# Patient Record
Sex: Male | Born: 1935 | Race: Black or African American | Hispanic: No | Marital: Married
Health system: Southern US, Community
[De-identification: ages and names within clinical notes are randomized; demographics above are authoritative.]

---

## 2009-01-08 ENCOUNTER — Encounter: Payer: Self-pay | Admitting: Internal Medicine

## 2009-02-01 ENCOUNTER — Encounter: Payer: Self-pay | Admitting: Internal Medicine

## 2009-12-07 ENCOUNTER — Emergency Department: Payer: Self-pay | Admitting: Emergency Medicine

## 2010-07-10 ENCOUNTER — Ambulatory Visit: Payer: Self-pay | Admitting: Ophthalmology

## 2010-07-22 ENCOUNTER — Ambulatory Visit: Payer: Self-pay | Admitting: Ophthalmology

## 2010-08-20 ENCOUNTER — Ambulatory Visit: Payer: Self-pay | Admitting: Ophthalmology

## 2010-08-26 ENCOUNTER — Ambulatory Visit: Payer: Self-pay | Admitting: Ophthalmology

## 2010-09-10 ENCOUNTER — Ambulatory Visit: Payer: Self-pay | Admitting: Gastroenterology

## 2011-02-05 ENCOUNTER — Ambulatory Visit: Payer: Self-pay | Admitting: Oncology

## 2011-03-05 ENCOUNTER — Ambulatory Visit: Payer: Self-pay | Admitting: Oncology

## 2011-03-25 ENCOUNTER — Ambulatory Visit: Payer: Self-pay | Admitting: Cardiovascular Disease

## 2011-03-30 ENCOUNTER — Ambulatory Visit: Payer: Self-pay | Admitting: Oncology

## 2011-04-04 ENCOUNTER — Ambulatory Visit: Payer: Self-pay | Admitting: Oncology

## 2011-04-09 ENCOUNTER — Inpatient Hospital Stay: Payer: Self-pay | Admitting: Internal Medicine

## 2011-06-29 ENCOUNTER — Ambulatory Visit: Payer: Self-pay | Admitting: Oncology

## 2011-07-03 ENCOUNTER — Ambulatory Visit: Payer: Self-pay | Admitting: Oncology

## 2011-08-21 ENCOUNTER — Ambulatory Visit: Payer: Self-pay | Admitting: Physician Assistant

## 2011-08-31 ENCOUNTER — Ambulatory Visit: Payer: Self-pay | Admitting: Oncology

## 2011-09-02 ENCOUNTER — Ambulatory Visit: Payer: Self-pay | Admitting: Oncology

## 2011-09-02 ENCOUNTER — Ambulatory Visit: Payer: Self-pay | Admitting: Physician Assistant

## 2011-09-11 ENCOUNTER — Ambulatory Visit: Payer: Self-pay | Admitting: Physician Assistant

## 2011-10-03 ENCOUNTER — Ambulatory Visit: Payer: Self-pay | Admitting: Physician Assistant

## 2011-10-26 ENCOUNTER — Ambulatory Visit: Payer: Self-pay | Admitting: Oncology

## 2011-10-26 LAB — CANCER CENTER HEMOGLOBIN: HGB: 9.7 g/dL — ABNORMAL LOW (ref 13.0–18.0)

## 2011-11-02 ENCOUNTER — Ambulatory Visit: Payer: Self-pay | Admitting: Oncology

## 2011-12-24 ENCOUNTER — Ambulatory Visit: Payer: Self-pay | Admitting: Oncology

## 2012-01-03 ENCOUNTER — Ambulatory Visit: Payer: Self-pay | Admitting: Oncology

## 2012-02-15 ENCOUNTER — Ambulatory Visit: Payer: Self-pay | Admitting: Oncology

## 2012-03-04 ENCOUNTER — Ambulatory Visit: Payer: Self-pay | Admitting: Oncology

## 2012-04-11 ENCOUNTER — Ambulatory Visit: Payer: Self-pay | Admitting: Oncology

## 2012-04-11 LAB — IRON AND TIBC
Iron Bind.Cap.(Total): 254 ug/dL (ref 250–450)
Iron Saturation: 31 %

## 2012-04-11 LAB — FERRITIN: Ferritin (ARMC): 115 ng/mL (ref 8–388)

## 2012-04-11 LAB — CANCER CENTER HEMOGLOBIN: HGB: 9.2 g/dL — ABNORMAL LOW (ref 13.0–18.0)

## 2012-04-17 ENCOUNTER — Inpatient Hospital Stay: Payer: Self-pay | Admitting: Family Medicine

## 2012-04-17 LAB — COMPREHENSIVE METABOLIC PANEL
Albumin: 2.7 g/dL — ABNORMAL LOW (ref 3.4–5.0)
Alkaline Phosphatase: 92 U/L (ref 50–136)
Anion Gap: 11 (ref 7–16)
BUN: 49 mg/dL — ABNORMAL HIGH (ref 7–18)
Calcium, Total: 8.5 mg/dL (ref 8.5–10.1)
Co2: 18 mmol/L — ABNORMAL LOW (ref 21–32)
EGFR (African American): 12 — ABNORMAL LOW
Glucose: 145 mg/dL — ABNORMAL HIGH (ref 65–99)
Potassium: 4.7 mmol/L (ref 3.5–5.1)
SGOT(AST): 21 U/L (ref 15–37)
SGPT (ALT): 15 U/L (ref 12–78)
Sodium: 145 mmol/L (ref 136–145)
Total Protein: 6.7 g/dL (ref 6.4–8.2)

## 2012-04-17 LAB — URINALYSIS, COMPLETE
Blood: NEGATIVE
Glucose,UR: 50 mg/dL (ref 0–75)
Ketone: NEGATIVE
Nitrite: NEGATIVE
Ph: 5 (ref 4.5–8.0)
Protein: >=500
RBC,UR: 5 /HPF (ref 0–5)
Specific Gravity: 1.023 (ref 1.003–1.030)
WBC UR: 6 /HPF (ref 0–5)

## 2012-04-17 LAB — CBC
HCT: 28.2 % — ABNORMAL LOW (ref 40.0–52.0)
MCH: 26.3 pg (ref 26.0–34.0)
MCHC: 32.6 g/dL (ref 32.0–36.0)
MCV: 81 fL (ref 80–100)
Platelet: 236 10*3/uL (ref 150–440)
RDW: 15.1 % — ABNORMAL HIGH (ref 11.5–14.5)
WBC: 10.2 10*3/uL (ref 3.8–10.6)

## 2012-04-17 LAB — CK TOTAL AND CKMB (NOT AT ARMC): CK-MB: 2.7 ng/mL (ref 0.5–3.6)

## 2012-04-18 LAB — CBC WITH DIFFERENTIAL/PLATELET
Basophil #: 0 10*3/uL (ref 0.0–0.1)
Eosinophil %: 2.1 %
HCT: 26.8 % — ABNORMAL LOW (ref 40.0–52.0)
MCH: 26.9 pg (ref 26.0–34.0)
MCHC: 33.3 g/dL (ref 32.0–36.0)
MCV: 81 fL (ref 80–100)
Monocyte #: 1.2 x10 3/mm — ABNORMAL HIGH (ref 0.2–1.0)
Neutrophil #: 4.3 10*3/uL (ref 1.4–6.5)
Neutrophil %: 60.4 %
Platelet: 219 10*3/uL (ref 150–440)
RBC: 3.32 10*6/uL — ABNORMAL LOW (ref 4.40–5.90)

## 2012-04-18 LAB — BASIC METABOLIC PANEL
Anion Gap: 7 (ref 7–16)
BUN: 47 mg/dL — ABNORMAL HIGH (ref 7–18)
Creatinine: 4.76 mg/dL — ABNORMAL HIGH (ref 0.60–1.30)
EGFR (African American): 13 — ABNORMAL LOW
Glucose: 74 mg/dL (ref 65–99)
Osmolality: 303 (ref 275–301)

## 2012-04-19 LAB — HEMOGLOBIN: HGB: 9.6 g/dL — ABNORMAL LOW (ref 13.0–18.0)

## 2012-04-19 LAB — IRON AND TIBC
Iron Bind.Cap.(Total): 218 ug/dL — ABNORMAL LOW (ref 250–450)
Iron Saturation: 22 %
Iron: 47 ug/dL — ABNORMAL LOW (ref 65–175)
Unbound Iron-Bind.Cap.: 171 ug/dL

## 2012-04-19 LAB — BASIC METABOLIC PANEL
Anion Gap: 7 (ref 7–16)
Calcium, Total: 8.4 mg/dL — ABNORMAL LOW (ref 8.5–10.1)
Creatinine: 4.54 mg/dL — ABNORMAL HIGH (ref 0.60–1.30)
EGFR (African American): 13 — ABNORMAL LOW
EGFR (Non-African Amer.): 12 — ABNORMAL LOW
Glucose: 88 mg/dL (ref 65–99)
Osmolality: 303 (ref 275–301)
Sodium: 147 mmol/L — ABNORMAL HIGH (ref 136–145)

## 2012-04-19 LAB — FERRITIN: Ferritin (ARMC): 39 ng/mL (ref 8–388)

## 2012-04-19 LAB — URINE CULTURE

## 2012-05-04 ENCOUNTER — Ambulatory Visit: Payer: Self-pay | Admitting: Oncology

## 2012-06-04 ENCOUNTER — Ambulatory Visit: Payer: Self-pay | Admitting: Oncology

## 2012-06-20 LAB — CBC CANCER CENTER
Basophil %: 0.8 %
Eosinophil %: 2.2 %
HGB: 8.4 g/dL — ABNORMAL LOW (ref 13.0–18.0)
Lymphocyte #: 1.4 x10 3/mm (ref 1.0–3.6)
Lymphocyte %: 23.9 %
MCHC: 33.2 g/dL (ref 32.0–36.0)
Monocyte #: 0.6 x10 3/mm (ref 0.2–1.0)
Monocyte %: 10.5 %
Neutrophil %: 62.6 %
RBC: 3.17 10*6/uL — ABNORMAL LOW (ref 4.40–5.90)
RDW: 14.4 % (ref 11.5–14.5)
WBC: 6 x10 3/mm (ref 3.8–10.6)

## 2012-07-02 ENCOUNTER — Ambulatory Visit: Payer: Self-pay | Admitting: Oncology

## 2012-07-06 ENCOUNTER — Ambulatory Visit: Payer: Self-pay | Admitting: Vascular Surgery

## 2012-07-06 LAB — BASIC METABOLIC PANEL
Calcium, Total: 8.1 mg/dL — ABNORMAL LOW (ref 8.5–10.1)
Chloride: 109 mmol/L — ABNORMAL HIGH (ref 98–107)
Co2: 21 mmol/L (ref 21–32)
Creatinine: 7.79 mg/dL — ABNORMAL HIGH (ref 0.60–1.30)
EGFR (African American): 7 — ABNORMAL LOW
Glucose: 282 mg/dL — ABNORMAL HIGH (ref 65–99)
Osmolality: 306 (ref 275–301)
Potassium: 4.6 mmol/L (ref 3.5–5.1)

## 2012-07-06 LAB — CBC
HCT: 27.8 % — ABNORMAL LOW (ref 40.0–52.0)
HGB: 9 g/dL — ABNORMAL LOW (ref 13.0–18.0)
MCH: 26.6 pg (ref 26.0–34.0)
MCHC: 32.4 g/dL (ref 32.0–36.0)
Platelet: 226 10*3/uL (ref 150–440)
RDW: 15.7 % — ABNORMAL HIGH (ref 11.5–14.5)
WBC: 4.6 10*3/uL (ref 3.8–10.6)

## 2012-07-13 ENCOUNTER — Ambulatory Visit: Payer: Self-pay | Admitting: Vascular Surgery

## 2012-08-02 ENCOUNTER — Inpatient Hospital Stay: Payer: Self-pay | Admitting: Internal Medicine

## 2012-08-02 LAB — CBC
HCT: 23.1 % — ABNORMAL LOW (ref 40.0–52.0)
HGB: 7.6 g/dL — ABNORMAL LOW (ref 13.0–18.0)
MCHC: 32.8 g/dL (ref 32.0–36.0)
MCV: 81 fL (ref 80–100)
Platelet: 240 10*3/uL (ref 150–440)

## 2012-08-02 LAB — COMPREHENSIVE METABOLIC PANEL
Alkaline Phosphatase: 70 U/L (ref 50–136)
Anion Gap: 10 (ref 7–16)
BUN: 80 mg/dL — ABNORMAL HIGH (ref 7–18)
Calcium, Total: 8 mg/dL — ABNORMAL LOW (ref 8.5–10.1)
Chloride: 114 mmol/L — ABNORMAL HIGH (ref 98–107)
Creatinine: 9.83 mg/dL — ABNORMAL HIGH (ref 0.60–1.30)
Potassium: 4.7 mmol/L (ref 3.5–5.1)
Sodium: 144 mmol/L (ref 136–145)

## 2012-08-02 LAB — URINALYSIS, COMPLETE
Glucose,UR: 150 mg/dL (ref 0–75)
Granular Cast: 3
Hyaline Cast: 1
Ketone: NEGATIVE
Leukocyte Esterase: NEGATIVE
Nitrite: NEGATIVE
Specific Gravity: 1.016 (ref 1.003–1.030)
Squamous Epithelial: <1
WBC UR: 7 /HPF (ref 0–5)

## 2012-08-03 LAB — CBC WITH DIFFERENTIAL/PLATELET
Basophil #: 0.1 10*3/uL (ref 0.0–0.1)
Eosinophil %: 3 %
Lymphocyte #: 1.1 10*3/uL (ref 1.0–3.6)
Lymphocyte %: 18.9 %
MCHC: 33.3 g/dL (ref 32.0–36.0)
MCV: 81 fL (ref 80–100)
Monocyte #: 0.9 x10 3/mm (ref 0.2–1.0)
Monocyte %: 16.2 %
Neutrophil %: 60.9 %
Platelet: 237 10*3/uL (ref 150–440)
RBC: 2.74 10*6/uL — ABNORMAL LOW (ref 4.40–5.90)
RDW: 14.7 % — ABNORMAL HIGH (ref 11.5–14.5)
WBC: 5.8 10*3/uL (ref 3.8–10.6)

## 2012-08-03 LAB — BASIC METABOLIC PANEL
Creatinine: 9.56 mg/dL — ABNORMAL HIGH (ref 0.60–1.30)
EGFR (African American): 5 — ABNORMAL LOW
EGFR (Non-African Amer.): 5 — ABNORMAL LOW
Glucose: 49 mg/dL — ABNORMAL LOW (ref 65–99)
Potassium: 4.4 mmol/L (ref 3.5–5.1)

## 2012-08-03 LAB — LIPID PANEL
Cholesterol: 146 mg/dL (ref 0–200)
Ldl Cholesterol, Calc: 76 mg/dL (ref 0–100)
Triglycerides: 61 mg/dL (ref 0–200)

## 2012-08-04 LAB — CBC WITH DIFFERENTIAL/PLATELET
Basophil #: 0 10*3/uL (ref 0.0–0.1)
Basophil %: 0.6 %
HGB: 7.8 g/dL — ABNORMAL LOW (ref 13.0–18.0)
Lymphocyte #: 1 10*3/uL (ref 1.0–3.6)
Lymphocyte %: 15.9 %
MCH: 26.4 pg (ref 26.0–34.0)
Monocyte #: 0.6 x10 3/mm (ref 0.2–1.0)
Neutrophil #: 4.7 10*3/uL (ref 1.4–6.5)
Neutrophil %: 72.8 %
Platelet: 269 10*3/uL (ref 150–440)
RDW: 14.7 % — ABNORMAL HIGH (ref 11.5–14.5)
WBC: 6.5 10*3/uL (ref 3.8–10.6)

## 2012-08-04 LAB — BASIC METABOLIC PANEL
Anion Gap: 8 (ref 7–16)
BUN: 59 mg/dL — ABNORMAL HIGH (ref 7–18)
Calcium, Total: 7.9 mg/dL — ABNORMAL LOW (ref 8.5–10.1)
Chloride: 112 mmol/L — ABNORMAL HIGH (ref 98–107)
Co2: 22 mmol/L (ref 21–32)
Creatinine: 8.12 mg/dL — ABNORMAL HIGH (ref 0.60–1.30)
EGFR (Non-African Amer.): 6 — ABNORMAL LOW
Osmolality: 306 (ref 275–301)
Sodium: 142 mmol/L (ref 136–145)

## 2012-08-04 LAB — PHOSPHORUS: Phosphorus: 4.9 mg/dL (ref 2.5–4.9)

## 2012-08-05 LAB — PHOSPHORUS: Phosphorus: 4.2 mg/dL (ref 2.5–4.9)

## 2012-08-06 LAB — BASIC METABOLIC PANEL
BUN: 29 mg/dL — ABNORMAL HIGH (ref 7–18)
Calcium, Total: 7.9 mg/dL — ABNORMAL LOW (ref 8.5–10.1)
Creatinine: 5.44 mg/dL — ABNORMAL HIGH (ref 0.60–1.30)
EGFR (Non-African Amer.): 9 — ABNORMAL LOW
Glucose: 36 mg/dL — CL (ref 65–99)
Osmolality: 282 (ref 275–301)
Sodium: 140 mmol/L (ref 136–145)

## 2012-08-11 ENCOUNTER — Ambulatory Visit: Payer: Self-pay | Admitting: Oncology

## 2012-08-18 ENCOUNTER — Ambulatory Visit: Payer: Self-pay | Admitting: Internal Medicine

## 2012-09-02 LAB — COMPREHENSIVE METABOLIC PANEL
Anion Gap: 7 (ref 7–16)
Bilirubin,Total: 0.2 mg/dL (ref 0.2–1.0)
Calcium, Total: 8.7 mg/dL (ref 8.5–10.1)
Chloride: 99 mmol/L (ref 98–107)
Co2: 29 mmol/L (ref 21–32)
Creatinine: 5.27 mg/dL — ABNORMAL HIGH (ref 0.60–1.30)
EGFR (Non-African Amer.): 10 — ABNORMAL LOW
Osmolality: 277 (ref 275–301)
SGOT(AST): 19 U/L (ref 15–37)
SGPT (ALT): 14 U/L (ref 12–78)
Sodium: 135 mmol/L — ABNORMAL LOW (ref 136–145)
Total Protein: 7.1 g/dL (ref 6.4–8.2)

## 2012-09-02 LAB — CBC
HGB: 9.8 g/dL — ABNORMAL LOW (ref 13.0–18.0)
MCH: 27.8 pg (ref 26.0–34.0)
MCHC: 33.7 g/dL (ref 32.0–36.0)
RBC: 3.52 10*6/uL — ABNORMAL LOW (ref 4.40–5.90)
WBC: 10 10*3/uL (ref 3.8–10.6)

## 2012-09-02 LAB — TROPONIN I: Troponin-I: <0.02 ng/mL

## 2012-09-02 LAB — APTT: Activated PTT: 26.4 secs (ref 23.6–35.9)

## 2012-09-02 LAB — PROTIME-INR
INR: 1
Prothrombin Time: 13.2 secs (ref 11.5–14.7)

## 2012-09-03 ENCOUNTER — Observation Stay: Payer: Self-pay | Admitting: Internal Medicine

## 2012-09-03 LAB — CBC WITH DIFFERENTIAL/PLATELET
Eosinophil #: 0.1 10*3/uL (ref 0.0–0.7)
Eosinophil %: 0.8 %
HGB: 9.4 g/dL — ABNORMAL LOW (ref 13.0–18.0)
Lymphocyte %: 19.3 %
MCHC: 33.7 g/dL (ref 32.0–36.0)
MCV: 82 fL (ref 80–100)
Neutrophil #: 5.2 10*3/uL (ref 1.4–6.5)
Neutrophil %: 63.6 %
Platelet: 255 10*3/uL (ref 150–440)
RDW: 16.7 % — ABNORMAL HIGH (ref 11.5–14.5)

## 2012-09-03 LAB — CK TOTAL AND CKMB (NOT AT ARMC)
CK, Total: 104 U/L (ref 35–232)
CK-MB: 0.7 ng/mL (ref 0.5–3.6)
CK-MB: 0.9 ng/mL (ref 0.5–3.6)

## 2012-09-03 LAB — BASIC METABOLIC PANEL
Anion Gap: 7 (ref 7–16)
BUN: 36 mg/dL — ABNORMAL HIGH (ref 7–18)
Chloride: 103 mmol/L (ref 98–107)
Co2: 31 mmol/L (ref 21–32)
Creatinine: 6.58 mg/dL — ABNORMAL HIGH (ref 0.60–1.30)
Osmolality: 291 (ref 275–301)
Potassium: 3.2 mmol/L — ABNORMAL LOW (ref 3.5–5.1)
Sodium: 141 mmol/L (ref 136–145)

## 2012-09-03 LAB — TROPONIN I: Troponin-I: <0.02 ng/mL

## 2012-09-04 ENCOUNTER — Ambulatory Visit: Payer: Self-pay | Admitting: Neurology

## 2012-09-04 LAB — LIPID PANEL
Cholesterol: 124 mg/dL (ref 0–200)
HDL Cholesterol: 63 mg/dL — ABNORMAL HIGH (ref 40–60)
Triglycerides: 57 mg/dL (ref 0–200)
VLDL Cholesterol, Calc: 11 mg/dL (ref 5–40)

## 2012-09-09 ENCOUNTER — Ambulatory Visit: Payer: Self-pay | Admitting: Oncology

## 2012-09-15 ENCOUNTER — Ambulatory Visit: Payer: Self-pay | Admitting: Internal Medicine

## 2012-09-22 ENCOUNTER — Emergency Department: Payer: Self-pay | Admitting: Emergency Medicine

## 2012-09-22 LAB — URINALYSIS, COMPLETE
Bilirubin,UR: NEGATIVE
Blood: NEGATIVE
Glucose,UR: NEGATIVE mg/dL (ref 0–75)
Ketone: NEGATIVE
Leukocyte Esterase: NEGATIVE
Nitrite: NEGATIVE
Ph: 9 (ref 4.5–8.0)
Protein: >=500
Specific Gravity: 1.011 (ref 1.003–1.030)
WBC UR: 2 /HPF (ref 0–5)

## 2012-09-22 LAB — CK TOTAL AND CKMB (NOT AT ARMC): CK, Total: 86 U/L (ref 35–232)

## 2012-09-22 LAB — CBC
MCHC: 32.8 g/dL (ref 32.0–36.0)
MCV: 86 fL (ref 80–100)
Platelet: 248 10*3/uL (ref 150–440)
RDW: 18.1 % — ABNORMAL HIGH (ref 11.5–14.5)
WBC: 7.6 10*3/uL (ref 3.8–10.6)

## 2012-09-22 LAB — PRO B NATRIURETIC PEPTIDE: B-Type Natriuretic Peptide: 3350 pg/mL — ABNORMAL HIGH (ref 0–450)

## 2012-09-23 LAB — COMPREHENSIVE METABOLIC PANEL
Calcium, Total: 9.1 mg/dL (ref 8.5–10.1)
Chloride: 100 mmol/L (ref 98–107)
Co2: 28 mmol/L (ref 21–32)
Creatinine: 7.78 mg/dL — ABNORMAL HIGH (ref 0.60–1.30)
EGFR (African American): 7 — ABNORMAL LOW
EGFR (Non-African Amer.): 6 — ABNORMAL LOW
Glucose: 139 mg/dL — ABNORMAL HIGH (ref 65–99)
SGPT (ALT): 14 U/L (ref 12–78)
Sodium: 137 mmol/L (ref 136–145)
Total Protein: 7.2 g/dL (ref 6.4–8.2)

## 2012-10-27 ENCOUNTER — Ambulatory Visit: Payer: Self-pay | Admitting: Physician Assistant

## 2012-10-30 ENCOUNTER — Observation Stay: Payer: Self-pay | Admitting: Internal Medicine

## 2012-10-30 LAB — URINALYSIS, COMPLETE
Bacteria: NONE SEEN
Bilirubin,UR: NEGATIVE
Blood: NEGATIVE
Glucose,UR: 50 mg/dL (ref 0–75)
Ketone: NEGATIVE
Leukocyte Esterase: NEGATIVE
Nitrite: NEGATIVE
Ph: 8 (ref 4.5–8.0)
Protein: >=500
Specific Gravity: 1.014 (ref 1.003–1.030)

## 2012-10-30 LAB — TROPONIN I
Troponin-I: <0.02 ng/mL
Troponin-I: 0.02 ng/mL

## 2012-10-30 LAB — CBC WITH DIFFERENTIAL/PLATELET
Basophil %: 1 %
Eosinophil #: 0 10*3/uL (ref 0.0–0.7)
HCT: 36.4 % — ABNORMAL LOW (ref 40.0–52.0)
HGB: 12.4 g/dL — ABNORMAL LOW (ref 13.0–18.0)
Lymphocyte %: 11.5 %
MCH: 28.4 pg (ref 26.0–34.0)
MCHC: 34 g/dL (ref 32.0–36.0)
MCV: 84 fL (ref 80–100)
Neutrophil %: 74.4 %
Platelet: 203 10*3/uL (ref 150–440)
RBC: 4.35 10*6/uL — ABNORMAL LOW (ref 4.40–5.90)
WBC: 8.5 10*3/uL (ref 3.8–10.6)

## 2012-10-30 LAB — COMPREHENSIVE METABOLIC PANEL
BUN: 71 mg/dL — ABNORMAL HIGH (ref 7–18)
Bilirubin,Total: 0.3 mg/dL (ref 0.2–1.0)
Calcium, Total: 9.2 mg/dL (ref 8.5–10.1)
Co2: 26 mmol/L (ref 21–32)
Creatinine: 10.11 mg/dL — ABNORMAL HIGH (ref 0.60–1.30)
EGFR (African American): 5 — ABNORMAL LOW
EGFR (Non-African Amer.): 4 — ABNORMAL LOW
Glucose: 118 mg/dL — ABNORMAL HIGH (ref 65–99)
Osmolality: 301 (ref 275–301)
Sodium: 140 mmol/L (ref 136–145)
Total Protein: 7.4 g/dL (ref 6.4–8.2)

## 2012-10-30 LAB — CK TOTAL AND CKMB (NOT AT ARMC)
CK, Total: 127 U/L (ref 35–232)
CK-MB: 0.7 ng/mL (ref 0.5–3.6)
CK-MB: 0.9 ng/mL (ref 0.5–3.6)

## 2012-10-31 LAB — CBC WITH DIFFERENTIAL/PLATELET
Basophil #: 0 10*3/uL (ref 0.0–0.1)
Basophil %: 0.5 %
Eosinophil #: 0 10*3/uL (ref 0.0–0.7)
Lymphocyte %: 12.9 %
MCH: 28.6 pg (ref 26.0–34.0)
Monocyte %: 15.9 %
Platelet: 185 10*3/uL (ref 150–440)
RBC: 4.04 10*6/uL — ABNORMAL LOW (ref 4.40–5.90)
RDW: 15.3 % — ABNORMAL HIGH (ref 11.5–14.5)

## 2012-10-31 LAB — BASIC METABOLIC PANEL
BUN: 74 mg/dL — ABNORMAL HIGH (ref 7–18)
Calcium, Total: 8.9 mg/dL (ref 8.5–10.1)
Co2: 25 mmol/L (ref 21–32)
Creatinine: 11.34 mg/dL — ABNORMAL HIGH (ref 0.60–1.30)
EGFR (African American): 4 — ABNORMAL LOW
Osmolality: 303 (ref 275–301)
Sodium: 140 mmol/L (ref 136–145)

## 2012-10-31 LAB — CK TOTAL AND CKMB (NOT AT ARMC)
CK, Total: 155 U/L (ref 35–232)
CK-MB: 0.9 ng/mL (ref 0.5–3.6)

## 2012-10-31 LAB — TROPONIN I: Troponin-I: 0.03 ng/mL

## 2012-10-31 LAB — PHOSPHORUS: Phosphorus: 5.7 mg/dL — ABNORMAL HIGH (ref 2.5–4.9)

## 2012-11-01 LAB — BASIC METABOLIC PANEL
Anion Gap: 8 (ref 7–16)
BUN: 40 mg/dL — ABNORMAL HIGH (ref 7–18)
Calcium, Total: 9 mg/dL (ref 8.5–10.1)
Chloride: 97 mmol/L — ABNORMAL LOW (ref 98–107)
Co2: 30 mmol/L (ref 21–32)
Creatinine: 8.06 mg/dL — ABNORMAL HIGH (ref 0.60–1.30)
EGFR (Non-African Amer.): 6 — ABNORMAL LOW
Glucose: 111 mg/dL — ABNORMAL HIGH (ref 65–99)
Potassium: 4 mmol/L (ref 3.5–5.1)
Sodium: 135 mmol/L — ABNORMAL LOW (ref 136–145)

## 2012-11-01 LAB — URINE CULTURE

## 2012-11-02 LAB — BASIC METABOLIC PANEL
Anion Gap: 10 (ref 7–16)
BUN: 50 mg/dL — ABNORMAL HIGH (ref 7–18)
Calcium, Total: 8.9 mg/dL (ref 8.5–10.1)
Chloride: 95 mmol/L — ABNORMAL LOW (ref 98–107)
Co2: 28 mmol/L (ref 21–32)
EGFR (Non-African Amer.): 5 — ABNORMAL LOW
Glucose: 116 mg/dL — ABNORMAL HIGH (ref 65–99)
Osmolality: 281 (ref 275–301)
Potassium: 3.8 mmol/L (ref 3.5–5.1)
Sodium: 133 mmol/L — ABNORMAL LOW (ref 136–145)

## 2012-11-04 LAB — CULTURE, BLOOD (SINGLE)

## 2012-11-17 ENCOUNTER — Observation Stay: Payer: Self-pay | Admitting: Internal Medicine

## 2012-11-17 LAB — COMPREHENSIVE METABOLIC PANEL
Albumin: 3 g/dL — ABNORMAL LOW (ref 3.4–5.0)
Alkaline Phosphatase: 74 U/L (ref 50–136)
BUN: 37 mg/dL — ABNORMAL HIGH (ref 7–18)
Bilirubin,Total: 0.4 mg/dL (ref 0.2–1.0)
Calcium, Total: 9.4 mg/dL (ref 8.5–10.1)
Chloride: 98 mmol/L (ref 98–107)
Co2: 27 mmol/L (ref 21–32)
Creatinine: 8.33 mg/dL — ABNORMAL HIGH (ref 0.60–1.30)
EGFR (Non-African Amer.): 6 — ABNORMAL LOW
Glucose: 143 mg/dL — ABNORMAL HIGH (ref 65–99)
Osmolality: 285 (ref 275–301)
SGOT(AST): 17 U/L (ref 15–37)
SGPT (ALT): 10 U/L — ABNORMAL LOW (ref 12–78)
Sodium: 137 mmol/L (ref 136–145)

## 2012-11-17 LAB — URINALYSIS, COMPLETE
Bilirubin,UR: NEGATIVE
Glucose,UR: NEGATIVE mg/dL (ref 0–75)
Hyaline Cast: 8
Ketone: NEGATIVE
Ph: 8 (ref 4.5–8.0)
Protein: >=500
RBC,UR: 262 /HPF (ref 0–5)
Specific Gravity: 1.014 (ref 1.003–1.030)
Squamous Epithelial: 1

## 2012-11-17 LAB — CBC
HCT: 32.2 % — ABNORMAL LOW (ref 40.0–52.0)
MCHC: 34.1 g/dL (ref 32.0–36.0)
MCV: 82 fL (ref 80–100)
RBC: 3.92 10*6/uL — ABNORMAL LOW (ref 4.40–5.90)
RDW: 14.6 % — ABNORMAL HIGH (ref 11.5–14.5)

## 2012-11-18 LAB — CBC WITH DIFFERENTIAL/PLATELET
Eosinophil #: 0.2 10*3/uL (ref 0.0–0.7)
HCT: 31.4 % — ABNORMAL LOW (ref 40.0–52.0)
MCH: 27.8 pg (ref 26.0–34.0)
MCHC: 33.5 g/dL (ref 32.0–36.0)
MCV: 83 fL (ref 80–100)
Monocyte #: 1.2 x10 3/mm — ABNORMAL HIGH (ref 0.2–1.0)
Monocyte %: 16.3 %
Neutrophil #: 4.3 10*3/uL (ref 1.4–6.5)
Neutrophil %: 57.7 %
Platelet: 237 10*3/uL (ref 150–440)
RDW: 14.3 % (ref 11.5–14.5)
WBC: 7.4 10*3/uL (ref 3.8–10.6)

## 2012-11-18 LAB — BASIC METABOLIC PANEL
Anion Gap: 14 (ref 7–16)
BUN: 45 mg/dL — ABNORMAL HIGH (ref 7–18)
Calcium, Total: 9 mg/dL (ref 8.5–10.1)
Chloride: 100 mmol/L (ref 98–107)
Co2: 25 mmol/L (ref 21–32)
Creatinine: 9.83 mg/dL — ABNORMAL HIGH (ref 0.60–1.30)
EGFR (African American): 5 — ABNORMAL LOW
Glucose: 99 mg/dL (ref 65–99)

## 2012-11-18 LAB — PHOSPHORUS: Phosphorus: 7.4 mg/dL — ABNORMAL HIGH (ref 2.5–4.9)

## 2012-11-19 LAB — ALBUMIN: Albumin: 2.8 g/dL — ABNORMAL LOW (ref 3.4–5.0)

## 2012-11-20 LAB — RENAL FUNCTION PANEL
Albumin: 2.8 g/dL — ABNORMAL LOW (ref 3.4–5.0)
EGFR (Non-African Amer.): 5 — ABNORMAL LOW
Glucose: 88 mg/dL (ref 65–99)
Osmolality: 285 (ref 275–301)
Phosphorus: 6.9 mg/dL — ABNORMAL HIGH (ref 2.5–4.9)
Sodium: 137 mmol/L (ref 136–145)

## 2012-11-21 LAB — PHOSPHORUS: Phosphorus: 5 mg/dL — ABNORMAL HIGH (ref 2.5–4.9)

## 2012-11-29 ENCOUNTER — Emergency Department: Payer: Self-pay | Admitting: Internal Medicine

## 2012-11-29 LAB — COMPREHENSIVE METABOLIC PANEL
Anion Gap: 9 (ref 7–16)
BUN: 52 mg/dL — ABNORMAL HIGH (ref 7–18)
Calcium, Total: 9.2 mg/dL (ref 8.5–10.1)
Chloride: 101 mmol/L (ref 98–107)
Creatinine: 8.68 mg/dL — ABNORMAL HIGH (ref 0.60–1.30)
EGFR (African American): 6 — ABNORMAL LOW
EGFR (Non-African Amer.): 5 — ABNORMAL LOW
Glucose: 148 mg/dL — ABNORMAL HIGH (ref 65–99)
Osmolality: 289 (ref 275–301)
Potassium: 4.6 mmol/L (ref 3.5–5.1)
SGPT (ALT): 14 U/L (ref 12–78)
Sodium: 136 mmol/L (ref 136–145)
Total Protein: 7.6 g/dL (ref 6.4–8.2)

## 2012-11-29 LAB — URINALYSIS, COMPLETE
Bacteria: NONE SEEN
Blood: NEGATIVE
Glucose,UR: NEGATIVE mg/dL (ref 0–75)
Hyaline Cast: 5
Leukocyte Esterase: NEGATIVE
Nitrite: NEGATIVE
Protein: >=500
RBC,UR: 2 /HPF (ref 0–5)
Specific Gravity: 1.015 (ref 1.003–1.030)
Squamous Epithelial: 1
WBC UR: 2 /HPF (ref 0–5)

## 2012-11-29 LAB — CBC
HCT: 32 % — ABNORMAL LOW (ref 40.0–52.0)
HGB: 10.8 g/dL — ABNORMAL LOW (ref 13.0–18.0)
MCH: 28.1 pg (ref 26.0–34.0)
MCHC: 33.9 g/dL (ref 32.0–36.0)
RBC: 3.85 10*6/uL — ABNORMAL LOW (ref 4.40–5.90)
WBC: 10 10*3/uL (ref 3.8–10.6)

## 2012-11-29 LAB — CK TOTAL AND CKMB (NOT AT ARMC)
CK, Total: 80 U/L (ref 35–232)
CK-MB: <0.5 ng/mL — ABNORMAL LOW (ref 0.5–3.6)

## 2013-03-29 ENCOUNTER — Emergency Department: Payer: Self-pay | Admitting: Emergency Medicine

## 2013-03-29 LAB — CBC
HCT: 37.5 % — ABNORMAL LOW (ref 40.0–52.0)
MCH: 28.5 pg (ref 26.0–34.0)
MCHC: 33.8 g/dL (ref 32.0–36.0)
MCV: 84 fL (ref 80–100)
Platelet: 212 10*3/uL (ref 150–440)
WBC: 7.5 10*3/uL (ref 3.8–10.6)

## 2013-04-22 LAB — CBC WITH DIFFERENTIAL/PLATELET
Basophil #: 0.1 10*3/uL (ref 0.0–0.1)
Basophil %: 0.7 %
Eosinophil #: 0 10*3/uL (ref 0.0–0.7)
Eosinophil %: 0.3 %
HCT: 33 % — ABNORMAL LOW (ref 40.0–52.0)
HGB: 10.9 g/dL — ABNORMAL LOW (ref 13.0–18.0)
Lymphocyte #: 1.9 10*3/uL (ref 1.0–3.6)
Lymphocyte %: 13.4 %
MCH: 27.4 pg (ref 26.0–34.0)
MCV: 83 fL (ref 80–100)
Monocyte %: 12.1 %
Neutrophil #: 10.4 10*3/uL — ABNORMAL HIGH (ref 1.4–6.5)
Platelet: 216 10*3/uL (ref 150–440)
RDW: 13.5 % (ref 11.5–14.5)
WBC: 14.2 10*3/uL — ABNORMAL HIGH (ref 3.8–10.6)

## 2013-04-22 LAB — URINALYSIS, COMPLETE
Bacteria: NONE SEEN
Bilirubin,UR: NEGATIVE
Ketone: NEGATIVE
Leukocyte Esterase: NEGATIVE
Nitrite: NEGATIVE
Ph: 9 (ref 4.5–8.0)
Protein: >=500
Specific Gravity: 1.012 (ref 1.003–1.030)
Squamous Epithelial: 3

## 2013-04-22 LAB — RAPID INFLUENZA A&B ANTIGENS

## 2013-04-22 LAB — AMMONIA: Ammonia, Plasma: <25 mcmol/L (ref 11–32)

## 2013-04-22 LAB — BASIC METABOLIC PANEL
Anion Gap: 8 (ref 7–16)
BUN: 46 mg/dL — ABNORMAL HIGH (ref 7–18)
Calcium, Total: 9 mg/dL (ref 8.5–10.1)
Chloride: 100 mmol/L (ref 98–107)
Co2: 29 mmol/L (ref 21–32)
Creatinine: 8.48 mg/dL — ABNORMAL HIGH (ref 0.60–1.30)
EGFR (African American): 6 — ABNORMAL LOW
EGFR (Non-African Amer.): 5 — ABNORMAL LOW
Glucose: 114 mg/dL — ABNORMAL HIGH (ref 65–99)
Sodium: 137 mmol/L (ref 136–145)

## 2013-04-22 LAB — TROPONIN I: Troponin-I: <0.02 ng/mL

## 2013-04-23 ENCOUNTER — Inpatient Hospital Stay: Payer: Self-pay | Admitting: Internal Medicine

## 2013-04-24 LAB — CBC WITH DIFFERENTIAL/PLATELET
Basophil #: 0 10*3/uL (ref 0.0–0.1)
Basophil %: 0.2 %
Eosinophil #: 0.1 10*3/uL (ref 0.0–0.7)
Eosinophil #: 0.2 10*3/uL (ref 0.0–0.7)
Eosinophil %: 1.5 %
HCT: 28.7 % — ABNORMAL LOW (ref 40.0–52.0)
HGB: 9.5 g/dL — ABNORMAL LOW (ref 13.0–18.0)
Lymphocyte #: 1.4 10*3/uL (ref 1.0–3.6)
Lymphocyte %: 12.1 %
Lymphocyte %: 12.2 %
MCHC: 33.3 g/dL (ref 32.0–36.0)
MCHC: 32.8 g/dL (ref 32.0–36.0)
MCV: 83 fL (ref 80–100)
MCV: 83 fL (ref 80–100)
Monocyte #: 1.3 x10 3/mm — ABNORMAL HIGH (ref 0.2–1.0)
Monocyte %: 11.4 %
Neutrophil #: 10.9 10*3/uL — ABNORMAL HIGH (ref 1.4–6.5)
Neutrophil #: 8.7 10*3/uL — ABNORMAL HIGH (ref 1.4–6.5)
Platelet: 185 10*3/uL (ref 150–440)
RBC: 3.51 10*6/uL — ABNORMAL LOW (ref 4.40–5.90)
RDW: 13.3 % (ref 11.5–14.5)
WBC: 11.6 10*3/uL — ABNORMAL HIGH (ref 3.8–10.6)

## 2013-04-24 LAB — BASIC METABOLIC PANEL
Anion Gap: 9 (ref 7–16)
Chloride: 97 mmol/L — ABNORMAL LOW (ref 98–107)
EGFR (African American): 4 — ABNORMAL LOW
EGFR (Non-African Amer.): 4 — ABNORMAL LOW
Osmolality: 293 (ref 275–301)
Sodium: 135 mmol/L — ABNORMAL LOW (ref 136–145)

## 2013-04-24 LAB — URINE CULTURE

## 2013-04-24 LAB — PHOSPHORUS: Phosphorus: 6.3 mg/dL — ABNORMAL HIGH (ref 2.5–4.9)

## 2013-04-24 LAB — ALBUMIN: Albumin: 3 g/dL — ABNORMAL LOW (ref 3.4–5.0)

## 2013-04-25 LAB — BASIC METABOLIC PANEL
Anion Gap: 8 (ref 7–16)
Calcium, Total: 8.9 mg/dL (ref 8.5–10.1)
Chloride: 96 mmol/L — ABNORMAL LOW (ref 98–107)
Co2: 32 mmol/L (ref 21–32)
Creatinine: 8.1 mg/dL — ABNORMAL HIGH (ref 0.60–1.30)
EGFR (African American): 7 — ABNORMAL LOW
EGFR (Non-African Amer.): 6 — ABNORMAL LOW
Glucose: 135 mg/dL — ABNORMAL HIGH (ref 65–99)
Osmolality: 287 (ref 275–301)
Sodium: 136 mmol/L (ref 136–145)

## 2013-04-25 LAB — CBC WITH DIFFERENTIAL/PLATELET
Basophil #: 0 10*3/uL (ref 0.0–0.1)
Basophil %: 0.4 %
HCT: 28.4 % — ABNORMAL LOW (ref 40.0–52.0)
HGB: 9.9 g/dL — ABNORMAL LOW (ref 13.0–18.0)
Lymphocyte #: 1.6 10*3/uL (ref 1.0–3.6)
MCV: 83 fL (ref 80–100)
Monocyte #: 1.8 x10 3/mm — ABNORMAL HIGH (ref 0.2–1.0)
Monocyte %: 19.1 %
Neutrophil #: 5.7 10*3/uL (ref 1.4–6.5)
Neutrophil %: 60.7 %
RDW: 13.2 % (ref 11.5–14.5)
WBC: 9.4 10*3/uL (ref 3.8–10.6)

## 2013-04-26 LAB — CBC WITH DIFFERENTIAL/PLATELET
Basophil #: 0.1 10*3/uL (ref 0.0–0.1)
Eosinophil #: 0.3 10*3/uL (ref 0.0–0.7)
Eosinophil %: 3 %
HGB: 9 g/dL — ABNORMAL LOW (ref 13.0–18.0)
Lymphocyte #: 2.2 10*3/uL (ref 1.0–3.6)
Lymphocyte %: 24.2 %
MCHC: 32.3 g/dL (ref 32.0–36.0)
MCV: 83 fL (ref 80–100)
Monocyte %: 19.1 %
Neutrophil #: 4.9 10*3/uL (ref 1.4–6.5)
Neutrophil %: 53.1 %
RBC: 3.34 10*6/uL — ABNORMAL LOW (ref 4.40–5.90)
RDW: 13.2 % (ref 11.5–14.5)
WBC: 9.3 10*3/uL (ref 3.8–10.6)

## 2013-04-26 LAB — BASIC METABOLIC PANEL
Anion Gap: 9 (ref 7–16)
BUN: 66 mg/dL — ABNORMAL HIGH (ref 7–18)
Calcium, Total: 9.3 mg/dL (ref 8.5–10.1)
Creatinine: 9.91 mg/dL — ABNORMAL HIGH (ref 0.60–1.30)
EGFR (African American): 5 — ABNORMAL LOW
EGFR (Non-African Amer.): 4 — ABNORMAL LOW
Osmolality: 291 (ref 275–301)
Potassium: 3.9 mmol/L (ref 3.5–5.1)
Sodium: 135 mmol/L — ABNORMAL LOW (ref 136–145)

## 2013-04-28 LAB — CULTURE, BLOOD (SINGLE)

## 2013-04-29 LAB — CULTURE, BLOOD (SINGLE)

## 2013-05-30 ENCOUNTER — Inpatient Hospital Stay: Payer: Self-pay | Admitting: Internal Medicine

## 2013-05-30 LAB — CBC WITH DIFFERENTIAL/PLATELET
BASOS ABS: 0.1 10*3/uL (ref 0.0–0.1)
BASOS PCT: 1 %
EOS ABS: 0.1 10*3/uL (ref 0.0–0.7)
EOS PCT: 1.3 %
HCT: 27.9 % — ABNORMAL LOW (ref 40.0–52.0)
HGB: 9.4 g/dL — ABNORMAL LOW (ref 13.0–18.0)
Lymphocyte #: 1.8 10*3/uL (ref 1.0–3.6)
Lymphocyte %: 26.1 %
MCH: 28.9 pg (ref 26.0–34.0)
MCHC: 33.8 g/dL (ref 32.0–36.0)
MCV: 86 fL (ref 80–100)
MONO ABS: 1.1 x10 3/mm — AB (ref 0.2–1.0)
Monocyte %: 15.5 %
NEUTROS ABS: 3.9 10*3/uL (ref 1.4–6.5)
Neutrophil %: 56.1 %
PLATELETS: 240 10*3/uL (ref 150–440)
RBC: 3.26 10*6/uL — ABNORMAL LOW (ref 4.40–5.90)
RDW: 14.5 % (ref 11.5–14.5)
WBC: 7 10*3/uL (ref 3.8–10.6)

## 2013-05-30 LAB — COMPREHENSIVE METABOLIC PANEL
ALK PHOS: 62 U/L
AST: 19 U/L (ref 15–37)
Albumin: 3.3 g/dL — ABNORMAL LOW (ref 3.4–5.0)
Anion Gap: 6 — ABNORMAL LOW (ref 7–16)
BILIRUBIN TOTAL: 0.5 mg/dL (ref 0.2–1.0)
BUN: 37 mg/dL — AB (ref 7–18)
CREATININE: 7.49 mg/dL — AB (ref 0.60–1.30)
Calcium, Total: 9.2 mg/dL (ref 8.5–10.1)
Chloride: 96 mmol/L — ABNORMAL LOW (ref 98–107)
Co2: 32 mmol/L (ref 21–32)
GFR CALC AF AMER: 7 — AB
GFR CALC NON AF AMER: 6 — AB
Glucose: 106 mg/dL — ABNORMAL HIGH (ref 65–99)
Osmolality: 277 (ref 275–301)
Potassium: 3.6 mmol/L (ref 3.5–5.1)
SGPT (ALT): 8 U/L — ABNORMAL LOW (ref 12–78)
Sodium: 134 mmol/L — ABNORMAL LOW (ref 136–145)
Total Protein: 7.5 g/dL (ref 6.4–8.2)

## 2013-05-31 LAB — PHOSPHORUS: PHOSPHORUS: 5.5 mg/dL — AB (ref 2.5–4.9)

## 2013-06-01 LAB — BASIC METABOLIC PANEL
Anion Gap: 6 — ABNORMAL LOW (ref 7–16)
BUN: 37 mg/dL — AB (ref 7–18)
CHLORIDE: 96 mmol/L — AB (ref 98–107)
Calcium, Total: 9.3 mg/dL (ref 8.5–10.1)
Co2: 31 mmol/L (ref 21–32)
Creatinine: 6.7 mg/dL — ABNORMAL HIGH (ref 0.60–1.30)
EGFR (African American): 8 — ABNORMAL LOW
GFR CALC NON AF AMER: 7 — AB
Glucose: 201 mg/dL — ABNORMAL HIGH (ref 65–99)
Osmolality: 281 (ref 275–301)
Potassium: 4.4 mmol/L (ref 3.5–5.1)
SODIUM: 133 mmol/L — AB (ref 136–145)

## 2013-06-12 LAB — COMPREHENSIVE METABOLIC PANEL
ALT: 10 U/L — AB (ref 12–78)
ANION GAP: 2 — AB (ref 7–16)
Albumin: 3.1 g/dL — ABNORMAL LOW (ref 3.4–5.0)
Alkaline Phosphatase: 72 U/L
BILIRUBIN TOTAL: 0.4 mg/dL (ref 0.2–1.0)
BUN: 21 mg/dL — AB (ref 7–18)
Calcium, Total: 9 mg/dL (ref 8.5–10.1)
Chloride: 96 mmol/L — ABNORMAL LOW (ref 98–107)
Co2: 36 mmol/L — ABNORMAL HIGH (ref 21–32)
Creatinine: 5.89 mg/dL — ABNORMAL HIGH (ref 0.60–1.30)
GFR CALC AF AMER: 10 — AB
GFR CALC NON AF AMER: 8 — AB
Glucose: 229 mg/dL — ABNORMAL HIGH (ref 65–99)
Osmolality: 278 (ref 275–301)
POTASSIUM: 3.5 mmol/L (ref 3.5–5.1)
SGOT(AST): 22 U/L (ref 15–37)
Sodium: 134 mmol/L — ABNORMAL LOW (ref 136–145)
Total Protein: 7.6 g/dL (ref 6.4–8.2)

## 2013-06-12 LAB — CBC WITH DIFFERENTIAL/PLATELET
Basophil #: 0 10*3/uL (ref 0.0–0.1)
Basophil %: 0.3 %
Eosinophil #: 0 10*3/uL (ref 0.0–0.7)
Eosinophil %: 0.1 %
HCT: 28.5 % — ABNORMAL LOW (ref 40.0–52.0)
HGB: 9.3 g/dL — ABNORMAL LOW (ref 13.0–18.0)
Lymphocyte #: 0.5 10*3/uL — ABNORMAL LOW (ref 1.0–3.6)
Lymphocyte %: 3.9 %
MCH: 28.5 pg (ref 26.0–34.0)
MCHC: 32.6 g/dL (ref 32.0–36.0)
MCV: 88 fL (ref 80–100)
Monocyte #: 0.8 x10 3/mm (ref 0.2–1.0)
Monocyte %: 5.9 %
NEUTROS ABS: 11.8 10*3/uL — AB (ref 1.4–6.5)
NEUTROS PCT: 89.8 %
Platelet: 233 10*3/uL (ref 150–440)
RBC: 3.26 10*6/uL — ABNORMAL LOW (ref 4.40–5.90)
RDW: 14.6 % — AB (ref 11.5–14.5)
WBC: 13.1 10*3/uL — ABNORMAL HIGH (ref 3.8–10.6)

## 2013-06-12 LAB — TROPONIN I: Troponin-I: <0.02 ng/mL

## 2013-06-13 ENCOUNTER — Inpatient Hospital Stay: Payer: Self-pay | Admitting: Internal Medicine

## 2013-06-14 LAB — COMPREHENSIVE METABOLIC PANEL
ALT: 11 U/L — AB (ref 12–78)
AST: 14 U/L — AB (ref 15–37)
Albumin: 3 g/dL — ABNORMAL LOW (ref 3.4–5.0)
Alkaline Phosphatase: 59 U/L
Anion Gap: 10 (ref 7–16)
BUN: 51 mg/dL — ABNORMAL HIGH (ref 7–18)
Bilirubin,Total: 0.3 mg/dL (ref 0.2–1.0)
CREATININE: 8.75 mg/dL — AB (ref 0.60–1.30)
Calcium, Total: 8.9 mg/dL (ref 8.5–10.1)
Chloride: 95 mmol/L — ABNORMAL LOW (ref 98–107)
Co2: 30 mmol/L (ref 21–32)
EGFR (Non-African Amer.): 5 — ABNORMAL LOW
GFR CALC AF AMER: 6 — AB
Glucose: 73 mg/dL (ref 65–99)
OSMOLALITY: 282 (ref 275–301)
POTASSIUM: 3.5 mmol/L (ref 3.5–5.1)
Sodium: 135 mmol/L — ABNORMAL LOW (ref 136–145)
TOTAL PROTEIN: 7.1 g/dL (ref 6.4–8.2)

## 2013-06-14 LAB — LIPID PANEL
Cholesterol: 131 mg/dL (ref 0–200)
HDL: 81 mg/dL — AB (ref 40–60)
LDL CHOLESTEROL, CALC: 39 mg/dL (ref 0–100)
TRIGLYCERIDES: 53 mg/dL (ref 0–200)
VLDL CHOLESTEROL, CALC: 11 mg/dL (ref 5–40)

## 2013-06-14 LAB — CBC WITH DIFFERENTIAL/PLATELET
BASOS PCT: 0.4 %
Basophil #: 0 10*3/uL (ref 0.0–0.1)
EOS ABS: 0.1 10*3/uL (ref 0.0–0.7)
Eosinophil %: 0.9 %
HCT: 26.9 % — ABNORMAL LOW (ref 40.0–52.0)
HGB: 9.1 g/dL — AB (ref 13.0–18.0)
Lymphocyte #: 2.6 10*3/uL (ref 1.0–3.6)
Lymphocyte %: 21.8 %
MCH: 29.5 pg (ref 26.0–34.0)
MCHC: 33.8 g/dL (ref 32.0–36.0)
MCV: 87 fL (ref 80–100)
MONOS PCT: 11 %
Monocyte #: 1.3 x10 3/mm — ABNORMAL HIGH (ref 0.2–1.0)
NEUTROS ABS: 7.7 10*3/uL — AB (ref 1.4–6.5)
Neutrophil %: 65.9 %
Platelet: 226 10*3/uL (ref 150–440)
RBC: 3.08 10*6/uL — AB (ref 4.40–5.90)
RDW: 14.7 % — AB (ref 11.5–14.5)
WBC: 11.7 10*3/uL — ABNORMAL HIGH (ref 3.8–10.6)

## 2013-06-14 LAB — PHOSPHORUS: PHOSPHORUS: 5.7 mg/dL — AB (ref 2.5–4.9)

## 2013-06-14 LAB — TSH: THYROID STIMULATING HORM: 1.95 u[IU]/mL

## 2013-06-14 LAB — MAGNESIUM: MAGNESIUM: 2.5 mg/dL — AB

## 2013-06-14 LAB — HEMOGLOBIN A1C: Hemoglobin A1C: 6.3 % (ref 4.2–6.3)

## 2013-06-15 LAB — CBC WITH DIFFERENTIAL/PLATELET
Basophil #: 0.1 10*3/uL (ref 0.0–0.1)
Basophil %: 0.7 %
EOS PCT: 0.3 %
Eosinophil #: 0 10*3/uL (ref 0.0–0.7)
HCT: 29.8 % — ABNORMAL LOW (ref 40.0–52.0)
HGB: 10.3 g/dL — AB (ref 13.0–18.0)
LYMPHS ABS: 1.9 10*3/uL (ref 1.0–3.6)
LYMPHS PCT: 21.1 %
MCH: 29.6 pg (ref 26.0–34.0)
MCHC: 34.5 g/dL (ref 32.0–36.0)
MCV: 86 fL (ref 80–100)
MONOS PCT: 12.9 %
Monocyte #: 1.2 x10 3/mm — ABNORMAL HIGH (ref 0.2–1.0)
Neutrophil #: 5.8 10*3/uL (ref 1.4–6.5)
Neutrophil %: 65 %
Platelet: 254 10*3/uL (ref 150–440)
RBC: 3.48 10*6/uL — ABNORMAL LOW (ref 4.40–5.90)
RDW: 14.6 % — AB (ref 11.5–14.5)
WBC: 8.9 10*3/uL (ref 3.8–10.6)

## 2013-06-17 LAB — CULTURE, BLOOD (SINGLE)

## 2013-07-18 ENCOUNTER — Ambulatory Visit: Payer: Self-pay | Admitting: Vascular Surgery

## 2013-07-31 ENCOUNTER — Inpatient Hospital Stay: Payer: Self-pay | Admitting: Internal Medicine

## 2013-07-31 LAB — CBC WITH DIFFERENTIAL/PLATELET
BASOS PCT: 0.5 %
Basophil #: 0 10*3/uL (ref 0.0–0.1)
EOS ABS: 0.1 10*3/uL (ref 0.0–0.7)
Eosinophil %: 1.3 %
HCT: 33.7 % — ABNORMAL LOW (ref 40.0–52.0)
HGB: 11.2 g/dL — ABNORMAL LOW (ref 13.0–18.0)
LYMPHS PCT: 18.7 %
Lymphocyte #: 1.3 10*3/uL (ref 1.0–3.6)
MCH: 28.6 pg (ref 26.0–34.0)
MCHC: 33.4 g/dL (ref 32.0–36.0)
MCV: 86 fL (ref 80–100)
MONOS PCT: 18.2 %
Monocyte #: 1.3 x10 3/mm — ABNORMAL HIGH (ref 0.2–1.0)
Neutrophil #: 4.4 10*3/uL (ref 1.4–6.5)
Neutrophil %: 61.3 %
Platelet: 224 10*3/uL (ref 150–440)
RBC: 3.93 10*6/uL — ABNORMAL LOW (ref 4.40–5.90)
RDW: 14.2 % (ref 11.5–14.5)
WBC: 7.2 10*3/uL (ref 3.8–10.6)

## 2013-07-31 LAB — COMPREHENSIVE METABOLIC PANEL
ALBUMIN: 3.7 g/dL (ref 3.4–5.0)
AST: 24 U/L (ref 15–37)
Alkaline Phosphatase: 80 U/L
Anion Gap: 9 (ref 7–16)
BUN: 28 mg/dL — ABNORMAL HIGH (ref 7–18)
Bilirubin,Total: 0.4 mg/dL (ref 0.2–1.0)
CHLORIDE: 95 mmol/L — AB (ref 98–107)
CO2: 32 mmol/L (ref 21–32)
CREATININE: 5.8 mg/dL — AB (ref 0.60–1.30)
Calcium, Total: 9.3 mg/dL (ref 8.5–10.1)
EGFR (Non-African Amer.): 9 — ABNORMAL LOW
GFR CALC AF AMER: 10 — AB
GLUCOSE: 113 mg/dL — AB (ref 65–99)
OSMOLALITY: 278 (ref 275–301)
Potassium: 3.4 mmol/L — ABNORMAL LOW (ref 3.5–5.1)
SGPT (ALT): 10 U/L — ABNORMAL LOW (ref 12–78)
Sodium: 136 mmol/L (ref 136–145)
TOTAL PROTEIN: 8.1 g/dL (ref 6.4–8.2)

## 2013-07-31 LAB — PRO B NATRIURETIC PEPTIDE: B-Type Natriuretic Peptide: 2441 pg/mL — ABNORMAL HIGH (ref 0–450)

## 2013-07-31 LAB — TSH: Thyroid Stimulating Horm: 2.11 u[IU]/mL

## 2013-07-31 LAB — TROPONIN I: Troponin-I: <0.02 ng/mL

## 2013-07-31 LAB — SALICYLATE LEVEL: Salicylates, Serum: <1.7 mg/dL

## 2013-08-01 LAB — PHOSPHORUS: Phosphorus: 6.7 mg/dL — ABNORMAL HIGH (ref 2.5–4.9)

## 2013-08-06 LAB — CULTURE, BLOOD (SINGLE)

## 2013-08-14 ENCOUNTER — Emergency Department: Payer: Self-pay | Admitting: Emergency Medicine

## 2013-08-14 LAB — CBC
HCT: 32.3 % — ABNORMAL LOW (ref 40.0–52.0)
HGB: 10.9 g/dL — ABNORMAL LOW (ref 13.0–18.0)
MCH: 29.2 pg (ref 26.0–34.0)
MCHC: 33.6 g/dL (ref 32.0–36.0)
MCV: 87 fL (ref 80–100)
Platelet: 209 10*3/uL (ref 150–440)
RBC: 3.73 10*6/uL — ABNORMAL LOW (ref 4.40–5.90)
RDW: 14.4 % (ref 11.5–14.5)
WBC: 7.2 10*3/uL (ref 3.8–10.6)

## 2013-08-14 LAB — COMPREHENSIVE METABOLIC PANEL
Albumin: 3.3 g/dL — ABNORMAL LOW (ref 3.4–5.0)
Alkaline Phosphatase: 65 U/L
Anion Gap: 9 (ref 7–16)
BUN: 25 mg/dL — ABNORMAL HIGH (ref 7–18)
Bilirubin,Total: 0.4 mg/dL (ref 0.2–1.0)
CHLORIDE: 99 mmol/L (ref 98–107)
Calcium, Total: 8.8 mg/dL (ref 8.5–10.1)
Co2: 31 mmol/L (ref 21–32)
Creatinine: 6.04 mg/dL — ABNORMAL HIGH (ref 0.60–1.30)
GFR CALC AF AMER: 9 — AB
GFR CALC NON AF AMER: 8 — AB
Glucose: 69 mg/dL (ref 65–99)
Osmolality: 280 (ref 275–301)
POTASSIUM: 3.1 mmol/L — AB (ref 3.5–5.1)
SGOT(AST): 23 U/L (ref 15–37)
SGPT (ALT): 13 U/L (ref 12–78)
Sodium: 139 mmol/L (ref 136–145)
Total Protein: 7.2 g/dL (ref 6.4–8.2)

## 2013-08-14 LAB — PROTIME-INR
INR: 1
Prothrombin Time: 12.7 secs (ref 11.5–14.7)

## 2013-08-14 LAB — TROPONIN I: Troponin-I: <0.02 ng/mL

## 2013-08-15 ENCOUNTER — Emergency Department: Payer: Self-pay | Admitting: Emergency Medicine

## 2013-08-15 LAB — CBC
HCT: 33.6 % — ABNORMAL LOW (ref 40.0–52.0)
HGB: 10.2 g/dL — ABNORMAL LOW (ref 13.0–18.0)
MCH: 26.8 pg (ref 26.0–34.0)
MCHC: 30.4 g/dL — AB (ref 32.0–36.0)
MCV: 88 fL (ref 80–100)
PLATELETS: 199 10*3/uL (ref 150–440)
RBC: 3.81 10*6/uL — ABNORMAL LOW (ref 4.40–5.90)
RDW: 14.6 % — ABNORMAL HIGH (ref 11.5–14.5)
WBC: 6.7 10*3/uL (ref 3.8–10.6)

## 2013-08-15 LAB — URINALYSIS, COMPLETE
BACTERIA: NONE SEEN
Bilirubin,UR: NEGATIVE
GLUCOSE, UR: NEGATIVE mg/dL (ref 0–75)
Ketone: NEGATIVE
LEUKOCYTE ESTERASE: NEGATIVE
Nitrite: NEGATIVE
PROTEIN: NEGATIVE
Ph: 7 (ref 4.5–8.0)
RBC, UR: NONE SEEN /HPF (ref 0–5)
SPECIFIC GRAVITY: 1.002 (ref 1.003–1.030)
Squamous Epithelial: <1
WBC UR: NONE SEEN /HPF (ref 0–5)

## 2013-08-15 LAB — TROPONIN I: Troponin-I: <0.02 ng/mL

## 2013-08-15 LAB — BASIC METABOLIC PANEL
ANION GAP: 8 (ref 7–16)
BUN: 47 mg/dL — ABNORMAL HIGH (ref 7–18)
CALCIUM: 9.2 mg/dL (ref 8.5–10.1)
Chloride: 96 mmol/L — ABNORMAL LOW (ref 98–107)
Co2: 32 mmol/L (ref 21–32)
Creatinine: 9.75 mg/dL — ABNORMAL HIGH (ref 0.60–1.30)
EGFR (African American): 5 — ABNORMAL LOW
GFR CALC NON AF AMER: 5 — AB
GLUCOSE: 103 mg/dL — AB (ref 65–99)
Osmolality: 284 (ref 275–301)
Potassium: 4.5 mmol/L (ref 3.5–5.1)
SODIUM: 136 mmol/L (ref 136–145)

## 2013-12-04 ENCOUNTER — Inpatient Hospital Stay: Payer: Self-pay | Admitting: Internal Medicine

## 2013-12-04 LAB — BASIC METABOLIC PANEL
Anion Gap: 12 (ref 7–16)
BUN: 73 mg/dL — ABNORMAL HIGH (ref 7–18)
Calcium, Total: 8.6 mg/dL (ref 8.5–10.1)
Chloride: 98 mmol/L (ref 98–107)
Co2: 25 mmol/L (ref 21–32)
Creatinine: 12.26 mg/dL — ABNORMAL HIGH (ref 0.60–1.30)
EGFR (African American): 4 — ABNORMAL LOW
EGFR (Non-African Amer.): 3 — ABNORMAL LOW
Glucose: 147 mg/dL — ABNORMAL HIGH (ref 65–99)
Osmolality: 294 (ref 275–301)
Potassium: 3.8 mmol/L (ref 3.5–5.1)
Sodium: 135 mmol/L — ABNORMAL LOW (ref 136–145)

## 2013-12-04 LAB — URINALYSIS, COMPLETE
Bacteria: NONE SEEN
Bilirubin,UR: NEGATIVE
Blood: NEGATIVE
Glucose,UR: 50 mg/dL (ref 0–75)
Ketone: NEGATIVE
Leukocyte Esterase: NEGATIVE
Nitrite: NEGATIVE
Ph: 7 (ref 4.5–8.0)
Protein: 100
RBC,UR: 1 /HPF (ref 0–5)
Specific Gravity: 1.01 (ref 1.003–1.030)
Squamous Epithelial: <1
WBC UR: <1 /HPF (ref 0–5)

## 2013-12-04 LAB — HEPATIC FUNCTION PANEL A (ARMC)
ALT: 12 U/L — AB
Albumin: 3 g/dL — ABNORMAL LOW (ref 3.4–5.0)
Alkaline Phosphatase: 79 U/L
Bilirubin,Total: 0.3 mg/dL (ref 0.2–1.0)
SGOT(AST): 15 U/L (ref 15–37)
Total Protein: 7.3 g/dL (ref 6.4–8.2)

## 2013-12-04 LAB — CBC
HCT: 32 % — ABNORMAL LOW (ref 40.0–52.0)
HGB: 10.4 g/dL — AB (ref 13.0–18.0)
MCH: 27.9 pg (ref 26.0–34.0)
MCHC: 32.4 g/dL (ref 32.0–36.0)
MCV: 86 fL (ref 80–100)
Platelet: 187 10*3/uL (ref 150–440)
RBC: 3.72 10*6/uL — ABNORMAL LOW (ref 4.40–5.90)
RDW: 13.4 % (ref 11.5–14.5)
WBC: 7.7 10*3/uL (ref 3.8–10.6)

## 2013-12-04 LAB — TROPONIN I
Troponin-I: <0.02 ng/mL
Troponin-I: <0.02 ng/mL

## 2013-12-04 LAB — LIPASE, BLOOD: Lipase: 155 U/L (ref 73–393)

## 2013-12-05 LAB — CBC WITH DIFFERENTIAL/PLATELET
BASOS ABS: 0 10*3/uL (ref 0.0–0.1)
Basophil %: 0.4 %
EOS ABS: 0.2 10*3/uL (ref 0.0–0.7)
Eosinophil %: 2.4 %
HCT: 28.9 % — ABNORMAL LOW (ref 40.0–52.0)
HGB: 10 g/dL — AB (ref 13.0–18.0)
Lymphocyte #: 1.2 10*3/uL (ref 1.0–3.6)
Lymphocyte %: 18.3 %
MCH: 29.1 pg (ref 26.0–34.0)
MCHC: 34.4 g/dL (ref 32.0–36.0)
MCV: 85 fL (ref 80–100)
MONOS PCT: 21.2 %
Monocyte #: 1.4 x10 3/mm — ABNORMAL HIGH (ref 0.2–1.0)
NEUTROS PCT: 57.7 %
Neutrophil #: 3.8 10*3/uL (ref 1.4–6.5)
PLATELETS: 176 10*3/uL (ref 150–440)
RBC: 3.42 10*6/uL — ABNORMAL LOW (ref 4.40–5.90)
RDW: 13.7 % (ref 11.5–14.5)
WBC: 6.5 10*3/uL (ref 3.8–10.6)

## 2013-12-05 LAB — COMPREHENSIVE METABOLIC PANEL
Albumin: 2.8 g/dL — ABNORMAL LOW (ref 3.4–5.0)
Alkaline Phosphatase: 78 U/L
Anion Gap: 15 (ref 7–16)
BUN: 88 mg/dL — ABNORMAL HIGH (ref 7–18)
Bilirubin,Total: 0.2 mg/dL (ref 0.2–1.0)
Calcium, Total: 8.3 mg/dL — ABNORMAL LOW (ref 8.5–10.1)
Chloride: 96 mmol/L — ABNORMAL LOW (ref 98–107)
Co2: 24 mmol/L (ref 21–32)
Creatinine: 13.24 mg/dL — ABNORMAL HIGH (ref 0.60–1.30)
EGFR (Non-African Amer.): 3 — ABNORMAL LOW
GFR CALC AF AMER: 4 — AB
Glucose: 119 mg/dL — ABNORMAL HIGH (ref 65–99)
Osmolality: 298 (ref 275–301)
Potassium: 3.9 mmol/L (ref 3.5–5.1)
SGOT(AST): 13 U/L — ABNORMAL LOW (ref 15–37)
SGPT (ALT): 12 U/L — ABNORMAL LOW
Sodium: 135 mmol/L — ABNORMAL LOW (ref 136–145)
TOTAL PROTEIN: 6.5 g/dL (ref 6.4–8.2)

## 2013-12-05 LAB — PHOSPHORUS: PHOSPHORUS: 6.6 mg/dL — AB (ref 2.5–4.9)

## 2013-12-06 LAB — COMPREHENSIVE METABOLIC PANEL
ALK PHOS: 64 U/L
Albumin: 2.7 g/dL — ABNORMAL LOW (ref 3.4–5.0)
Anion Gap: 6 — ABNORMAL LOW (ref 7–16)
BILIRUBIN TOTAL: 0.3 mg/dL (ref 0.2–1.0)
BUN: 40 mg/dL — AB (ref 7–18)
CALCIUM: 8.4 mg/dL — AB (ref 8.5–10.1)
CO2: 32 mmol/L (ref 21–32)
CREATININE: 8.75 mg/dL — AB (ref 0.60–1.30)
Chloride: 98 mmol/L (ref 98–107)
EGFR (Non-African Amer.): 5 — ABNORMAL LOW
GFR CALC AF AMER: 6 — AB
Glucose: 45 mg/dL — ABNORMAL LOW (ref 65–99)
Osmolality: 279 (ref 275–301)
Potassium: 3.5 mmol/L (ref 3.5–5.1)
SGOT(AST): 16 U/L (ref 15–37)
SGPT (ALT): 11 U/L — ABNORMAL LOW
Sodium: 136 mmol/L (ref 136–145)
TOTAL PROTEIN: 7 g/dL (ref 6.4–8.2)

## 2013-12-06 LAB — CBC WITH DIFFERENTIAL/PLATELET
BASOS ABS: 0 10*3/uL (ref 0.0–0.1)
Basophil %: 0.3 %
EOS PCT: 1.9 %
Eosinophil #: 0.1 10*3/uL (ref 0.0–0.7)
HCT: 30.3 % — ABNORMAL LOW (ref 40.0–52.0)
HGB: 10.1 g/dL — ABNORMAL LOW (ref 13.0–18.0)
LYMPHS PCT: 17.7 %
Lymphocyte #: 1.1 10*3/uL (ref 1.0–3.6)
MCH: 28.4 pg (ref 26.0–34.0)
MCHC: 33.4 g/dL (ref 32.0–36.0)
MCV: 85 fL (ref 80–100)
MONO ABS: 1.4 x10 3/mm — AB (ref 0.2–1.0)
Monocyte %: 21.6 %
NEUTROS ABS: 3.7 10*3/uL (ref 1.4–6.5)
Neutrophil %: 58.5 %
Platelet: 179 10*3/uL (ref 150–440)
RBC: 3.56 10*6/uL — ABNORMAL LOW (ref 4.40–5.90)
RDW: 13.7 % (ref 11.5–14.5)
WBC: 6.4 10*3/uL (ref 3.8–10.6)

## 2013-12-06 LAB — PHOSPHORUS: PHOSPHORUS: 4.5 mg/dL (ref 2.5–4.9)

## 2013-12-09 LAB — CULTURE, BLOOD (SINGLE)

## 2014-01-04 ENCOUNTER — Emergency Department: Payer: Self-pay | Admitting: Internal Medicine

## 2014-01-04 LAB — CBC WITH DIFFERENTIAL/PLATELET
BASOS ABS: 0.1 10*3/uL (ref 0.0–0.1)
BASOS PCT: 0.9 %
Eosinophil #: 0.1 10*3/uL (ref 0.0–0.7)
Eosinophil %: 1 %
HCT: 33.9 % — ABNORMAL LOW (ref 40.0–52.0)
HGB: 11.1 g/dL — ABNORMAL LOW (ref 13.0–18.0)
Lymphocyte #: 1.3 10*3/uL (ref 1.0–3.6)
Lymphocyte %: 14.6 %
MCH: 27.5 pg (ref 26.0–34.0)
MCHC: 32.7 g/dL (ref 32.0–36.0)
MCV: 84 fL (ref 80–100)
MONO ABS: 1.2 x10 3/mm — AB (ref 0.2–1.0)
Monocyte %: 13.5 %
NEUTROS PCT: 70 %
Neutrophil #: 6.5 10*3/uL (ref 1.4–6.5)
PLATELETS: 258 10*3/uL (ref 150–440)
RBC: 4.03 10*6/uL — ABNORMAL LOW (ref 4.40–5.90)
RDW: 13.5 % (ref 11.5–14.5)
WBC: 9.2 10*3/uL (ref 3.8–10.6)

## 2014-01-04 LAB — URINALYSIS, COMPLETE
BILIRUBIN, UR: NEGATIVE
Bacteria: NONE SEEN
Blood: NEGATIVE
GLUCOSE, UR: NEGATIVE mg/dL (ref 0–75)
KETONE: NEGATIVE
LEUKOCYTE ESTERASE: NEGATIVE
Nitrite: NEGATIVE
PH: 7 (ref 4.5–8.0)
Protein: 100
RBC,UR: NONE SEEN /HPF (ref 0–5)
Specific Gravity: 1.015 (ref 1.003–1.030)
WBC UR: NONE SEEN /HPF (ref 0–5)

## 2014-01-04 LAB — COMPREHENSIVE METABOLIC PANEL
ALK PHOS: 77 U/L
Albumin: 3.3 g/dL — ABNORMAL LOW (ref 3.4–5.0)
Anion Gap: 9 (ref 7–16)
BUN: 32 mg/dL — AB (ref 7–18)
Bilirubin,Total: 0.5 mg/dL (ref 0.2–1.0)
CALCIUM: 9.5 mg/dL (ref 8.5–10.1)
Chloride: 93 mmol/L — ABNORMAL LOW (ref 98–107)
Co2: 29 mmol/L (ref 21–32)
Creatinine: 8.34 mg/dL — ABNORMAL HIGH (ref 0.60–1.30)
EGFR (African American): 6 — ABNORMAL LOW
GFR CALC NON AF AMER: 5 — AB
GLUCOSE: 103 mg/dL — AB (ref 65–99)
Osmolality: 270 (ref 275–301)
Potassium: 3.5 mmol/L (ref 3.5–5.1)
SGOT(AST): 16 U/L (ref 15–37)
SGPT (ALT): 9 U/L — ABNORMAL LOW
Sodium: 131 mmol/L — ABNORMAL LOW (ref 136–145)
TOTAL PROTEIN: 7.9 g/dL (ref 6.4–8.2)

## 2014-01-04 LAB — LIPASE, BLOOD: Lipase: 83 U/L (ref 73–393)

## 2014-01-04 LAB — TROPONIN I: Troponin-I: <0.02 ng/mL

## 2014-02-21 ENCOUNTER — Emergency Department: Payer: Self-pay | Admitting: Student

## 2014-02-21 LAB — COMPREHENSIVE METABOLIC PANEL
ALK PHOS: 77 U/L
AST: 17 U/L (ref 15–37)
Albumin: 3 g/dL — ABNORMAL LOW (ref 3.4–5.0)
Anion Gap: 9 (ref 7–16)
BUN: 79 mg/dL — AB (ref 7–18)
Bilirubin,Total: 0.2 mg/dL (ref 0.2–1.0)
CALCIUM: 8.3 mg/dL — AB (ref 8.5–10.1)
CO2: 28 mmol/L (ref 21–32)
CREATININE: 12.43 mg/dL — AB (ref 0.60–1.30)
Chloride: 105 mmol/L (ref 98–107)
EGFR (Non-African Amer.): 4 — ABNORMAL LOW
GFR CALC AF AMER: 5 — AB
GLUCOSE: 128 mg/dL — AB (ref 65–99)
Osmolality: 308 (ref 275–301)
Potassium: 4.2 mmol/L (ref 3.5–5.1)
SGPT (ALT): 9 U/L — ABNORMAL LOW
Sodium: 142 mmol/L (ref 136–145)
TOTAL PROTEIN: 7.2 g/dL (ref 6.4–8.2)

## 2014-02-21 LAB — TROPONIN I: Troponin-I: <0.02 ng/mL

## 2014-02-21 LAB — CBC
HCT: 29.5 % — ABNORMAL LOW (ref 40.0–52.0)
HGB: 9.5 g/dL — AB (ref 13.0–18.0)
MCH: 27.6 pg (ref 26.0–34.0)
MCHC: 32.3 g/dL (ref 32.0–36.0)
MCV: 85 fL (ref 80–100)
Platelet: 229 10*3/uL (ref 150–440)
RBC: 3.46 10*6/uL — ABNORMAL LOW (ref 4.40–5.90)
RDW: 15.1 % — AB (ref 11.5–14.5)
WBC: 5.5 10*3/uL (ref 3.8–10.6)

## 2014-02-21 LAB — PROTIME-INR
INR: 1
PROTHROMBIN TIME: 13.5 s (ref 11.5–14.7)

## 2014-02-21 LAB — APTT: Activated PTT: 28.6 secs (ref 23.6–35.9)

## 2014-04-18 ENCOUNTER — Emergency Department: Payer: Self-pay | Admitting: Student

## 2014-04-18 LAB — COMPREHENSIVE METABOLIC PANEL
ALK PHOS: 78 U/L
ANION GAP: 10 (ref 7–16)
AST: 18 U/L (ref 15–37)
Albumin: 2.6 g/dL — ABNORMAL LOW (ref 3.4–5.0)
BUN: 31 mg/dL — ABNORMAL HIGH (ref 7–18)
Bilirubin,Total: 0.4 mg/dL (ref 0.2–1.0)
CALCIUM: 8.8 mg/dL (ref 8.5–10.1)
CHLORIDE: 98 mmol/L (ref 98–107)
CREATININE: 9.11 mg/dL — AB (ref 0.60–1.30)
Co2: 32 mmol/L (ref 21–32)
EGFR (Non-African Amer.): 6 — ABNORMAL LOW
GFR CALC AF AMER: 7 — AB
Glucose: 66 mg/dL (ref 65–99)
Osmolality: 284 (ref 275–301)
POTASSIUM: 3.3 mmol/L — AB (ref 3.5–5.1)
SGPT (ALT): 10 U/L — ABNORMAL LOW
SODIUM: 140 mmol/L (ref 136–145)
Total Protein: 7 g/dL (ref 6.4–8.2)

## 2014-04-18 LAB — CBC
HCT: 31.8 % — AB (ref 40.0–52.0)
HGB: 10.3 g/dL — ABNORMAL LOW (ref 13.0–18.0)
MCH: 27.7 pg (ref 26.0–34.0)
MCHC: 32.4 g/dL (ref 32.0–36.0)
MCV: 85 fL (ref 80–100)
Platelet: 258 10*3/uL (ref 150–440)
RBC: 3.72 10*6/uL — AB (ref 4.40–5.90)
RDW: 15.6 % — ABNORMAL HIGH (ref 11.5–14.5)
WBC: 6.1 10*3/uL (ref 3.8–10.6)

## 2014-04-18 LAB — CK TOTAL AND CKMB (NOT AT ARMC): CK, Total: 66 U/L (ref 39–308)

## 2014-04-18 LAB — URINALYSIS, COMPLETE
BILIRUBIN, UR: NEGATIVE
Bacteria: NONE SEEN
Blood: NEGATIVE
Glucose,UR: NEGATIVE mg/dL (ref 0–75)
Hyaline Cast: 5
KETONE: NEGATIVE
LEUKOCYTE ESTERASE: NEGATIVE
NITRITE: NEGATIVE
Ph: 9 (ref 4.5–8.0)
RBC,UR: 2 /HPF (ref 0–5)
Specific Gravity: 1.008 (ref 1.003–1.030)
Squamous Epithelial: 1

## 2014-04-18 LAB — APTT: Activated PTT: 29.2 secs (ref 23.6–35.9)

## 2014-04-18 LAB — PROTIME-INR
INR: 1.2
Prothrombin Time: 14.6 secs (ref 11.5–14.7)

## 2014-04-18 LAB — TROPONIN I: Troponin-I: 0.02 ng/mL

## 2014-04-23 LAB — CULTURE, BLOOD (SINGLE)

## 2014-07-20 ENCOUNTER — Emergency Department: Payer: Self-pay | Admitting: Emergency Medicine

## 2014-07-20 IMAGING — CR DG CHEST 2V
1 series · 2 of 2 positions shown · non-contrast
Comparison: none

REASON FOR EXAM: persistent congestion, former smoker
COMMENTS:

[Series 1: pa · 0.17mm/px · 2 of 2 slices shown]
[im 1/2]
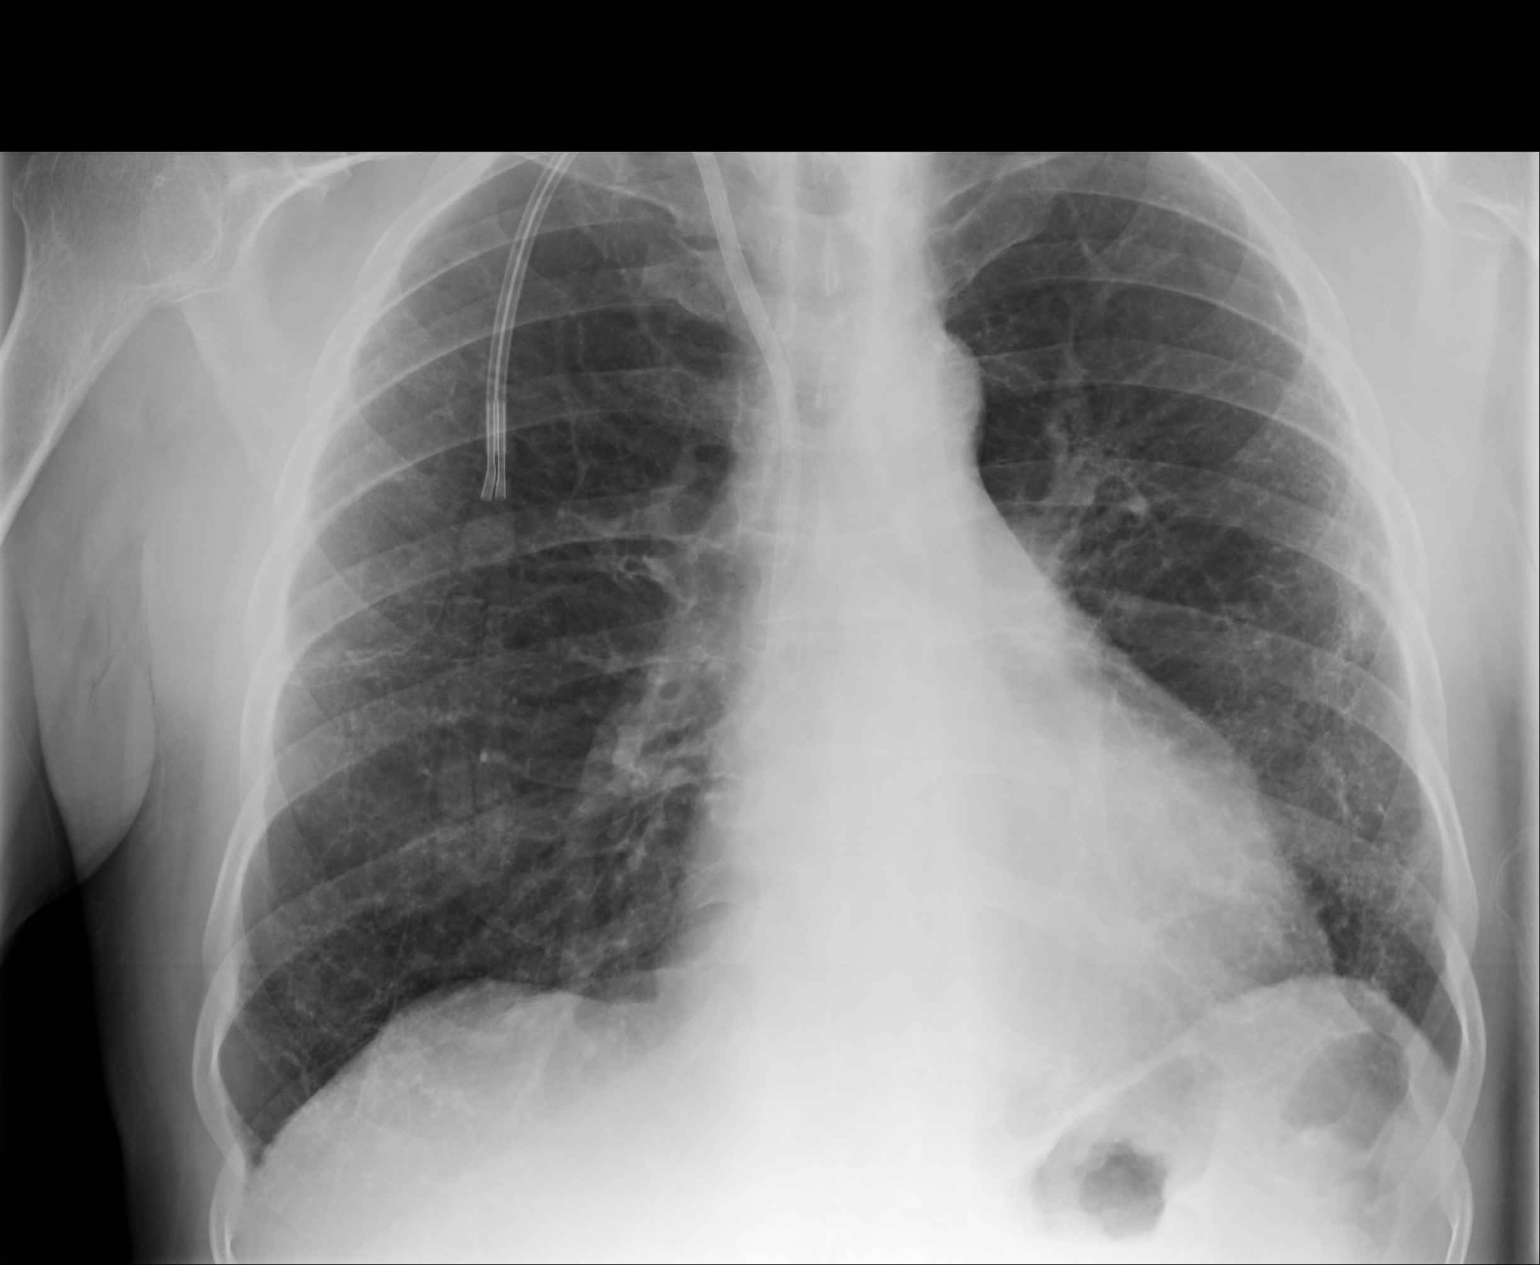
[im 2/2]
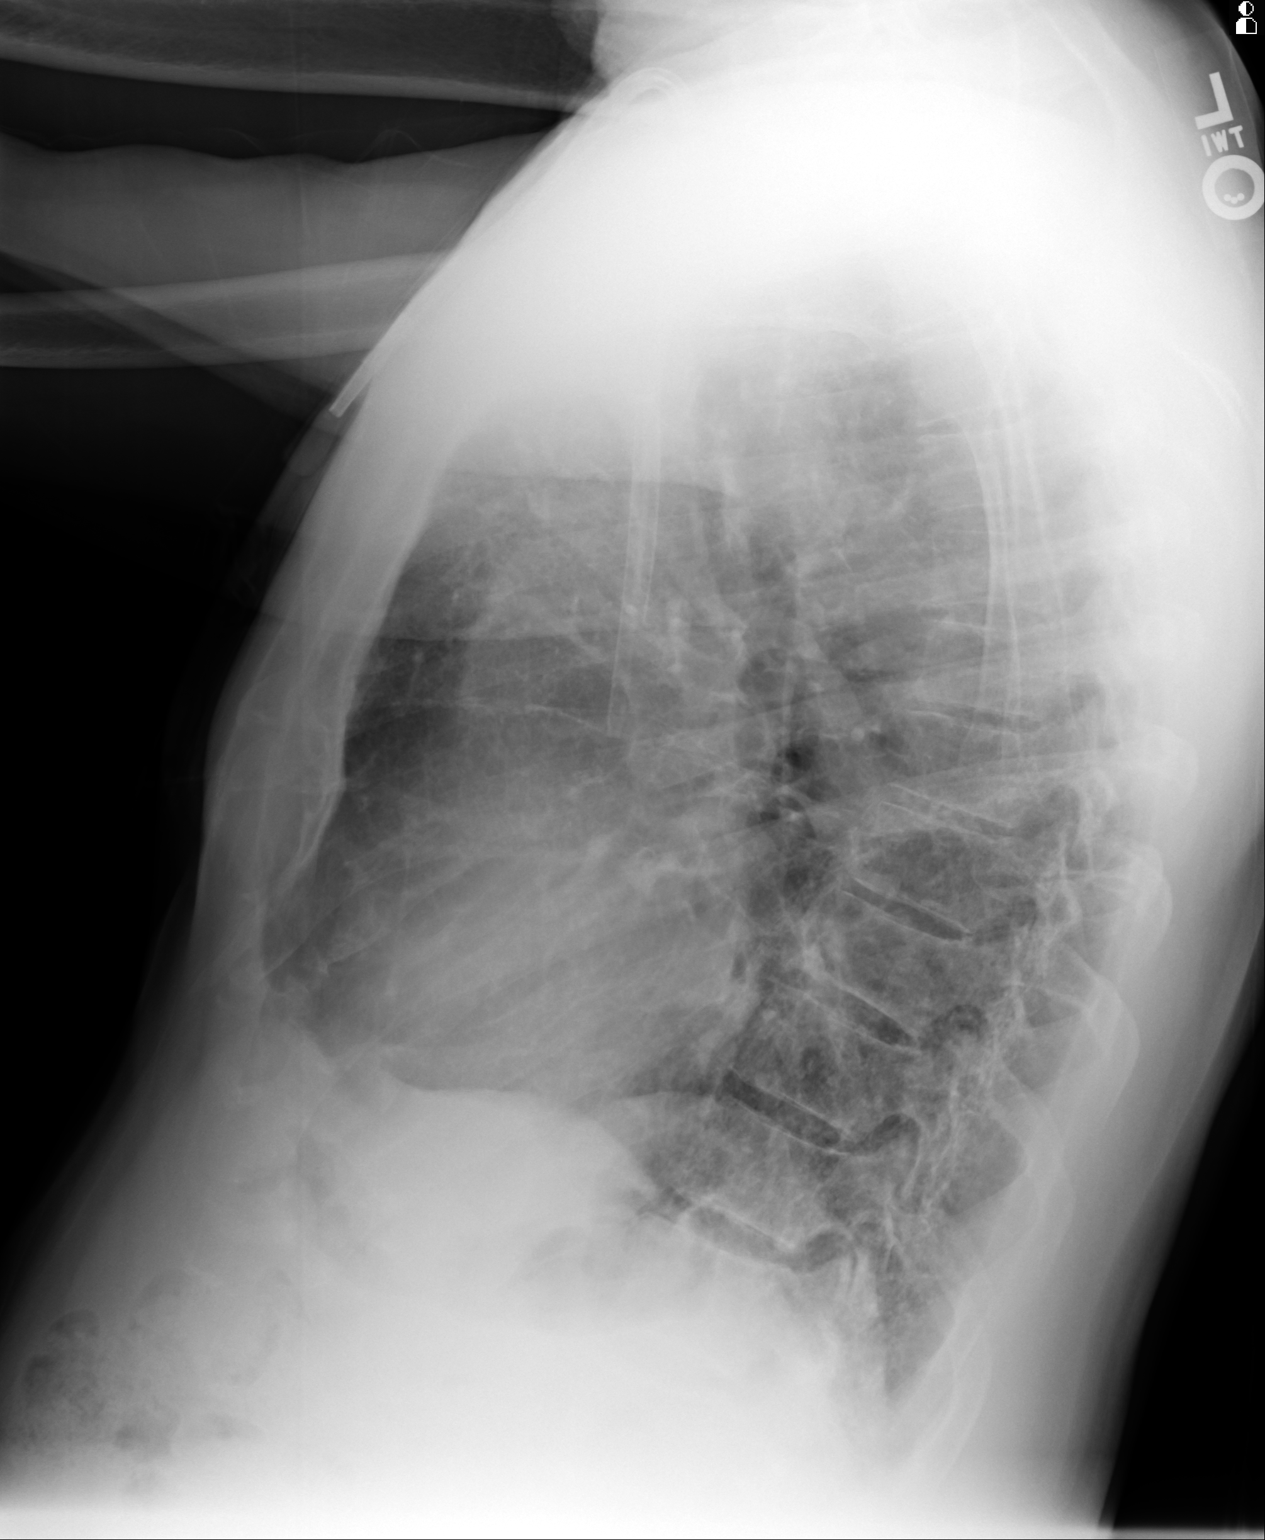

[2 of 2 positions shown; findings below may reference images not displayed]

PROCEDURE:     KDR - KDXR CHEST PA (OR AP) AND LAT  - August 18, 2012  [DATE]

RESULT:     Comparison is made to the previous examination dated 17 April, 2012. There is a dual lumen central venous catheter present with the tip in
the right atrium. The lungs are clear. The heart and pulmonary vessels are
normal. The bony and mediastinal structures appear unremarkable.
IMPRESSION: 1. Mild hyperinflation. Central venous catheter present with the tip at the
superior portion of the right atrium. No evidence of pneumothorax.

[REDACTED]

## 2014-08-18 ENCOUNTER — Emergency Department
Admit: 2014-08-18 | Disposition: A | Discharge status: Home / Self Care | Payer: Self-pay | Admitting: Emergency Medicine

## 2014-08-18 LAB — CBC
HCT: 37.2 % — ABNORMAL LOW (ref 40.0–52.0)
HGB: 12.1 g/dL — ABNORMAL LOW (ref 13.0–18.0)
MCH: 27.2 pg (ref 26.0–34.0)
MCHC: 32.4 g/dL (ref 32.0–36.0)
MCV: 84 fL (ref 80–100)
PLATELETS: 193 10*3/uL (ref 150–440)
RBC: 4.44 10*6/uL (ref 4.40–5.90)
RDW: 16.4 % — AB (ref 11.5–14.5)
WBC: 8 10*3/uL (ref 3.8–10.6)

## 2014-08-18 LAB — URINALYSIS, COMPLETE
BACTERIA: NONE SEEN
Bilirubin,UR: NEGATIVE
Blood: NEGATIVE
Glucose,UR: NEGATIVE mg/dL (ref 0–75)
Ketone: NEGATIVE
Leukocyte Esterase: NEGATIVE
NITRITE: NEGATIVE
PH: 7 (ref 4.5–8.0)
Specific Gravity: 1.018 (ref 1.003–1.030)

## 2014-08-18 LAB — BASIC METABOLIC PANEL
Anion Gap: 12 (ref 7–16)
BUN: 25 mg/dL — ABNORMAL HIGH
Calcium, Total: 9 mg/dL
Chloride: 97 mmol/L — ABNORMAL LOW
Co2: 31 mmol/L
Creatinine: 6.26 mg/dL — ABNORMAL HIGH
EGFR (Non-African Amer.): 8 — ABNORMAL LOW
GFR CALC AF AMER: 9 — AB
Glucose: 134 mg/dL — ABNORMAL HIGH
Potassium: 3.5 mmol/L
Sodium: 140 mmol/L

## 2014-08-20 ENCOUNTER — Emergency Department
Admit: 2014-08-20 | Disposition: A | Discharge status: Home / Self Care | Payer: Self-pay | Admitting: Emergency Medicine

## 2014-08-20 LAB — BASIC METABOLIC PANEL
Anion Gap: 18 — ABNORMAL HIGH (ref 7–16)
BUN: 87 mg/dL — ABNORMAL HIGH
CALCIUM: 8.4 mg/dL — AB
CO2: 29 mmol/L
Chloride: 91 mmol/L — ABNORMAL LOW
Creatinine: 13.49 mg/dL — ABNORMAL HIGH
EGFR (African American): 4 — ABNORMAL LOW
EGFR (Non-African Amer.): 3 — ABNORMAL LOW
GLUCOSE: 214 mg/dL — AB
Potassium: 4.2 mmol/L
SODIUM: 138 mmol/L

## 2014-08-20 LAB — CBC
HCT: 32.6 % — ABNORMAL LOW (ref 40.0–52.0)
HGB: 10.9 g/dL — AB (ref 13.0–18.0)
MCH: 27.7 pg (ref 26.0–34.0)
MCHC: 33.3 g/dL (ref 32.0–36.0)
MCV: 83 fL (ref 80–100)
Platelet: 127 10*3/uL — ABNORMAL LOW (ref 150–440)
RBC: 3.92 10*6/uL — AB (ref 4.40–5.90)
RDW: 16.8 % — ABNORMAL HIGH (ref 11.5–14.5)
WBC: 5.1 10*3/uL (ref 3.8–10.6)

## 2014-08-21 NOTE — Consult Note (Signed)
Reason for Consult: AoCKD Mr Lottis a 81 year old man with CKD (baseline Cr was in the 2's but most recent was 4.1) from DM. He has been [facility] and most recently was seen by Dr Arvin CollardMottl on 01/18/12. At that time, it was noted that his renal function was much worse than baseline (Cr was 4.1). It was requested that he have followup labs and then return to clinic. However, [facility] with delirium and SOB. His Cr was 5.1 on admission and with hydration, his Cr is currently 4.8. The patient cannot recall much of the events that precipitated his admission. He recalls that he did not eat or drink much prior to church yesterday and then afterwards, he remembered feeling dizzy. His wife called the rescue squad who [facility]. He had a Head CT and CXR in the ED. currently denies any problems except that a non-productive cough. He also feels SOB (which is not new and close to baseline). He denies any NV/anorexia/CP. He also admits to headache. As per HPI. Balance of ROS negative. 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 U/A not surprisingly has proteinuria in setting of diabetic nephropathy.now, would stop IVF after he has received a total of 1L. He appears to be taking in oral fluids adequately. Agree with renal US. Supportive care (no ACE/ARB/Diuretic) CKD3/4-  baseline Cr was in the 2's and there was most recently progressive kidney disease with a Cr of 4.1. Unclear if this is the patient's new baseline. Certainly, he will need continued followup as an outpatient for conversations about RRT options. Currently, no acute indication for dialysis.phos. Continue bicarb COPD- poor air movement on exam. Defer treatment of COPD to primary team Edema- trace on exam. Would stop IVF. HTN- increase metoprolol as needed to achieve SBP <150.  Proteinuria- from diabetes. assess patient on a daily basis. Page [phone] for any questions.  Electronic Signatures: Molli BarrowsVoora, Abriel Hattery (MD)  (Signed on 16-Dec-13 10:37)  Authored  Last Updated: 16-Dec-13 10:37 by Molli BarrowsVoora, Briel Gallicchio (MD)

## 2014-08-21 NOTE — Discharge Summary (Signed)
PATIENT NAME:  Benjamin Freeman, Benjamin Freeman MR#:  [id number] DATE OF BIRTH:  1931/07/07  REASON FOR ADMISSION: Altered mental status, somnolence and weakness.   DISCHARGE DIAGNOSES: 1.  Metabolic encephalopathy, likely due to acute bronchitis and worsening of renal failure.  2.  Atrial fibrillation, rate controlled.  3.  Acute on chronic renal failure.  4.  Acute bronchitis.  5.  Diabetes.   MEDICATIONS AT DISCHARGE:  Plavix 75 mg once a day, amlodipine 10 mg once a day, Crestor 10 mg once a day, vitamin D3 at 1000 units once a day, fish oil 1000 units once a day, doxazosin 8 mg once daily, hydralazine 100 mg 3 times a day, Lantus 20 units once a day at bedtime, metoprolol 25 mg twice daily, bicarbonate 650 take half tablet twice daily, acetaminophen 325 mg 2 tablets every 4 hours p.r.n., azithromycin 250 mg once a day, Combivent 2 puffs 4 times a day as needed for shortness of breath. Stop cephalexin 500 mg once daily.   HOSPITAL COURSE:  This patient is a nice 81 year old gentleman who has history of anemia of chronic disease, CKD, iron deficiency, coronary artery disease, prior CVAs in the past, multiple times, BPH, diabetes and hypertension. The patient has presented to the hospital with the complaint of being confused and weak while he was in church on the morning of admission for what his wife brought him here to the ER. Apparently, the patient was leaning to the side, his eyes were closed and very somnolent, difficult to arouse.   On evaluation in the ER the patient has been complaining of chills for the past 2 to 3 days, cough and significant wheezing, but his temperature was 97.5, his pulse was 68 and respirations were 18, although he sounded very coarse on examination with diminished breath sounds bilaterally, but there were not significant rhonchi or wheezing. The patient was admitted with a diagnosis of altered mental status likely due to infectious process versus just pure dehydration. I discussed the  case with Dr. Austin MilesVoora from nephrology and we determined that the patient did not have a UTI, although he was treated previously with cephalexin which could mask some of the findings. The patient did have some increase in shortness of breath and cough, but no fever, at least that we can find during this hospitalization, but the patient was significantly dehydrated when he came.   The patient had an elevation of his troponin up to 5.1 when his baseline is around 4. As per the wife and family, the patient was not drinking fluids, was not really keeping hydrated and he was in a very dry environment with a heater at home for which he got dehydrated really quickly. His sodium was 147. His creatinine at discharge actually went down to 4.5. His chloride was 120, his GFR was around 12. His blood sugars remained stable during this hospitalization, went up to 200s at some point, but that was likely due to stress, but they were usually below 150. His white count was 10.2 on admission, at discharge was 7.2. His hemoglobin 9.2 and at discharge 9.6. His UA, as mentioned above, did not have any signs of infection, only 6 white blood cells, but no leukocyte esterase, no nitrites, His ABG showed only a pCO2 of 33 and pH of 7.36.  He is discharged on azithromycin just to cover for exacerbation of COPD.  Other medical problems were stable during this hospitalization.   I spent about 35 minutes with this discharge.  ____________________________ Felipa Furnace, MD rsg:ct D: 04/20/2012 13:45:00 ET T: 04/20/2012 14:10:36 ET JOB#: [id number]  cc: Burnett Harry. Blanco, [place] Felipa Furnace, MD, <Dictator> Molli Barrows, MD     Regan Rakers Juanda Chance MD ELECTRONICALLY SIGNED 04/21/2012 13:55

## 2014-08-23 LAB — CULTURE, BLOOD (SINGLE)

## 2014-08-24 ENCOUNTER — Inpatient Hospital Stay
Admit: 2014-08-24 | Disposition: A | Discharge status: Other Acute Inpatient Hospital | Payer: Self-pay | Attending: Internal Medicine | Admitting: Internal Medicine

## 2014-08-24 LAB — CBC
HCT: 34.6 % — ABNORMAL LOW (ref 40.0–52.0)
HGB: 11.3 g/dL — ABNORMAL LOW (ref 13.0–18.0)
MCH: 27 pg (ref 26.0–34.0)
MCHC: 32.6 g/dL (ref 32.0–36.0)
MCV: 83 fL (ref 80–100)
PLATELETS: 167 10*3/uL (ref 150–440)
RBC: 4.18 10*6/uL — ABNORMAL LOW (ref 4.40–5.90)
RDW: 16.4 % — ABNORMAL HIGH (ref 11.5–14.5)
WBC: 8.7 10*3/uL (ref 3.8–10.6)

## 2014-08-24 LAB — COMPREHENSIVE METABOLIC PANEL
ALBUMIN: 3.2 g/dL — AB
ALT: 17 U/L
AST: 40 U/L
Alkaline Phosphatase: 60 U/L
Anion Gap: 10 (ref 7–16)
BILIRUBIN TOTAL: 0.5 mg/dL
BUN: 16 mg/dL
Calcium, Total: 8.3 mg/dL — ABNORMAL LOW
Chloride: 94 mmol/L — ABNORMAL LOW
Co2: 36 mmol/L — ABNORMAL HIGH
Creatinine: 4.37 mg/dL — ABNORMAL HIGH
EGFR (African American): 14 — ABNORMAL LOW
GFR CALC NON AF AMER: 12 — AB
Glucose: 159 mg/dL — ABNORMAL HIGH
Potassium: 3.1 mmol/L — ABNORMAL LOW
SODIUM: 140 mmol/L
TOTAL PROTEIN: 7.8 g/dL

## 2014-08-24 LAB — TROPONIN I: TROPONIN-I: 0.09 ng/mL — AB

## 2014-08-24 LAB — PRO B NATRIURETIC PEPTIDE: B-Type Natriuretic Peptide: 92 pg/mL

## 2014-08-24 LAB — AMMONIA: AMMONIA, PLASMA: 13 umol/L

## 2014-08-24 NOTE — Consult Note (Signed)
Brief Consult Note: Diagnosis: recurrent seizure, h/o stroke - aphasia, ? vascular dementia?.   Patient was seen by consultant.   Consult note dictated.   Comments: - Known seizure disorder - poor medication compliance (wife mentioned that she finds pills near his table on regular basis) - Increased seizure frequency after dialysis (may need higher dose) - rencent MRI on 11/02/12 - reviewed - don't think it is necessary to repeat one - family agrees - EEG - slow - no subclinical seizures or epileptiform discharges. - can continue fosphenytoin for short period of time but can be discharged on only one Anti Epileptic Drug  - knows seizure precautions and restrictions including driving, discussed seizure first aid with family. 2) stroke - left frontal - left MCA, bil cerebellar, severe corona radiata WM changes. On plavix, consider statin. 3) possible vascular dementia - wife handels finances, his medications etc. can consider cognitive meds as out pt. - unfortunately no coverage over the weekend - will inform Dr. Malvin JohnsPotter.  Electronic Signatures: Jolene ProvostShah, Layson Bertsch Kalpeshkumar (MD)  (Signed [phone]-Jul-14 21:48)  Authored: Brief Consult Note   Last Updated: 18-Jul-14 21:48 by Jolene ProvostShah, Modest Draeger Kalpeshkumar (MD)

## 2014-08-24 NOTE — H&P (Signed)
PATIENT NAME:  Benjamin Freeman, Benjamin Freeman MR#:  [id number] DATE OF BIRTH:  [birth year]  DATE OF ADMISSION:  09/02/2012  PRIMARY CARE PHYSICIAN:  The patient was seeing [facility], but the doctor has left, so he is the process of changing doctors. He is seen by nephrology as outpatient.   EMERGENCY DEPARTMENT REFERRING PHYSICIAN: Dr. Suella Broad.  ADMITTING DIAGNOSES:  Possible seizure, transient ischemic attack, near syncope.   HISTORY OF PRESENT ILLNESS:  The patient is a 81 year old African American male who was recently hospitalized and was newly started on hemodialysis since April. He had an episode where his eyes rolled back in his head earlier today, and then according to his wife, his right hand contracted. He was very confused. This lasted for about a half hour. A similar thing happened 2 months ago, but he did not seek any medical care. The patient currently is back to baseline. He continues to feel very weak and tired since starting dialysis, but is getting more used to it.  He denies any chest pains. No palpitations. The patient also recently has been seen by Dr. Meredeth Ide for shortness of breath and is thought to have possible diagnosis of COPD. The patient, otherwise, denies any fevers. He does have chronic intermittent cough. Denies any abdominal pain, nausea, vomiting or diarrhea.   PAST MEDICAL HISTORY: Significant for:  1.  Diabetes.  2.  Hypertension.  3.  Previous history of CVA.  4.  End-stage renal disease.   ALLERGIES:  None.   CURRENT MEDICATIONS:  As an outpatient: Amlodipine 10 daily, Ellipta 1 puff daily, Plavix 75 p.o. daily, Crestor 10 daily, doxazosin 8 mg 1 tab p.o. daily, fish oil 1000 mg daily, hydralazine 100 mg 1 tab p.o. t.i.d., Lantus 6 units at bedtime, metoprolol tartrate 25 mg 1 tab p.o. b.i.d., sertraline 25 p.o. daily, sodium bicarb 650 mg 1 tab p.o. b.i.d., Ventolin 90 mcg 1 puff 4 times per day as needed for shortness of breath, vitamin D2 50,000 International  Units daily.   SOCIAL HISTORY:  Used to be a smoker, quit 20 years ago. He used to drink about 20 years ago. No IV drug use. He lives at home with his wife.   FAMILY HISTORY:  Both mother and father had high blood pressure, but no history of chronic kidney disease.   REVIEW OF SYSTEMS:  CONSTITUTIONAL:  Denies any fevers. Complains of fatigue, weakness. No pain. No weight loss. No weight gain.   EYES:  No blurred or double vision. No pain. No redness. No inflammation. No glaucoma. No cataracts.  ENT:  Tinnitus. No ear pain. No hearing loss. No seasonal or year-round allergies. No epistaxis. No nasal discharge. No difficulty swallowing.  RESPIRATORY: Has chronic cough, shortness of breath. No hemoptysis. Recent diagnosis of COPD.  CARDIOVASCULAR:  Denies any chest pain, orthopnea. No edema. No arrhythmia.  GASTROINTESTINAL:  No nausea, vomiting, diarrhea. No abdominal pain. No hematemesis. No melena.  GENITOURINARY:  Denies any dysuria, hematuria, renal calc or frequency.  ENDOCRINE:  Denies any polyuria, nocturia or thyroid problems.  HEMATOLOGIC AND LYMPHATIC:  Denies anemia, easy bruisability or bleeding.  SKIN:  No acne. No rash. No changes in mole, hair or skin.  MUSCULOSKELETAL:  Denies any pain in the neck, back or shoulder.  NEUROLOGIC:  Has a history of CVA.  PSYCHIATRIC:  No anxiety. No insomnia. No ADD.   PHYSICAL EXAMINATION: VITAL SIGNS:  Temperature 97.8, pulse 81, respiration 20, blood pressure 114/44, O2 of 98%.  GENERAL:  The patient is a well-developed [place] male in no acute distress.  HEENT:  Head atraumatic, normocephalic. Pupils equally round, reactive to light and accommodation. There is no conjunctival pallor. No scleral icterus. Nasal exam shows no drainage or ulceration.  Oropharynx is clear without any exudate.  EARS: no drainage, no erythema NECK:  No thyromegaly. No carotid bruits.  CARDIOVASCULAR:  Regular rate and rhythm. No murmurs, rubs, clicks or  gallops. PMI is not displaced.  ABDOMEN:  Soft, nontender, nondistended. Positive bowel sounds x 4. no hepatomegaly GU: deffered EXTREMITIES:  No clubbing, cyanosis or edema.  Muskuloskeletal : no erythema and swelling, full rom SKIN:  No rash. no lesions LYMPHATICS:  No lymph nodes palpable.  VASCULAR:  Good DP, PT pulses.  PSYCHIATRIC:  Not anxious or depressed.   NEUROLOGICAL:  Awake, alert, oriented x 3. No focal deficits.    IMAGING AND LABORATORY DATA:  Glucose 145, BUN 24, creatinine 5.27, sodium 135, potassium 3.1, chloride 99, CO2 is 29, calcium 8.7. Albumin 2.9, the rest of the LFTs are normal. CPK 94, CK-MB is 0.6, troponin less than 0.02. WBC 10.0, hemoglobin 9.8, platelet count 281. INR 1.0. CT scan of the head shows no acute intracranial processes.   ASSESSMENT AND PLAN:  The patient is a 81 year old African American male with a history of end-stage renal disease, hypertension, diabetes, previous cerebrovascular accident, presents with an episode of rolling his eyes and confused, brief episode of his hands contracting. His confusion lasted for about an hour. He is currently back to baseline.  1.  Acute episode of confusion, speech changes, changes in extremities, possibly due to transient ischemic attack, possible seizure. At this time, we will go ahead and check carotid Dopplers, echocardiogram, place him on tele. We will also get an EEG. The patient may need outpatient neurology evaluation. I will hold off on any antiepileptics for the time being.  2.  End-stage renal disease. We will ask nephrology to see for dialysis if he is still here.    3.  Hypokalemia. I will give him low-dose potassium.  4.  Hypertension. We will continue Norvasc, hydralazine, metoprolol.  5.  Diabetes. We will place him on sliding scale insulin and Lantus.  6.  Hyperlipidemia. Continue Crestor as taking previously.  7.  Miscellaneous: The patient will have SCDs for DVT prophylaxis, since he is already on  aspirin and Plavix.    NOTE:  45 minutes spent on H and P.      ____________________________ Lacie ScottsShreyang H. Allena KatzPatel, MD shp:dmm D: 09/02/2012 18:08:42 ET T: 09/02/2012 20:54:05 ET JOB#: [id number]  cc: Langley Flatley H. Allena KatzPatel, MD, <Dictator> Charise CarwinSHREYANG H Anysia Choi MD ELECTRONICALLY SIGNED 09/10/2012 14:31

## 2014-08-24 NOTE — Op Note (Signed)
PATIENT NAME:  Benjamin Freeman, Benjamin Freeman MR#:  [id number] DATE OF BIRTH:  [birth year]  DATE OF PROCEDURE:  10/27/2012  PREOPERATIVE DIAGNOSIS: End-stage renal disease, with functional permanent dialysis access and no longer needing PermCath.   POSTOPERATIVE DIAGNOSIS: Same. End-stage renal disease, with functional permanent dialysis access and no longer needing PermCath.   PROCEDURE PERFORMED: Removal of right jugular PermCath.   SURGEON: Teacher, English as a foreign languageChelsea Makenize Messman.  ANESTHESIA: Local.  ESTIMATED BLOOD LOSS: Minimal.   INDICATION FOR THE PROCEDURE: The patient is a 81 year old African American male with end-stage renal disease. His access is functional and he no longer needs his PermCath, therefore this will be removed.   DESCRIPTION OF THE PROCEDURE: The patient was brought to the vascular interventional radiology area and positioned supine. The right neck and chest, and existing catheter were sterilely prepped and draped and a sterile surgical field was created. The area was locally anesthetized copiously with 1% lidocaine. Hemostats were used to help dissect out the cuff and remove the catheter. The catheter was then removed in its entirety without difficulty with gentle traction. Pressure was held at the base of the neck. A sterile dressing was placed. The patient tolerated the procedure well. No complications.    ____________________________ Hoyle Sauerhelsea N. Quillan Whitter, PA-C cnh:dm D: 10/27/2012 08:48:24 ET T: 10/27/2012 09:06:01 ET JOB#: [id number]  cc: Hoyle Sauerhelsea N. Jazlyne Gauger, PA-C, <Dictator> Cris Gibby N Shana Zavaleta PA ELECTRONICALLY SIGNED 11/11/2012 17:50

## 2014-08-24 NOTE — H&P (Signed)
PATIENT NAME:  Benjamin Freeman, Benjamin Freeman MR#:  [id number] DATE OF BIRTH:  [birth year]  DATE OF ADMISSION:  11/17/2012  PRIMARY CARE PHYSICIAN: Bluford Main, MD  REFERRING PHYSICIAN:  Daryel November, MD  CHIEF COMPLAINT: Altered mental status.   HISTORY OF PRESENT ILLNESS: The patient is a 81 year old African American male with history of recently diagnosed complex partial seizure on Keppra, end stage renal disease on dialysis Monday, Wednesday and Friday, diabetes, hypertension and history of stroke. Of note, the patient was discharged in early July for altered mental status and confusion episode which was deemed to be possibly secondary to postictal state, and he was supposed to follow with neurology; however, he has had no chance to do so. The patient's wife is in the room who is providing history as at this point the patient is still confused. The patient was brought in after having confusion. When I discussed the case with the wife, who was in the room, she states the patient possibly had a shake episode on Monday of the upper extremities. It was felt mostly in the hands. He has had off and on confusion. He had another shaking episode of the hands last night that lasted about 4 to 5 minutes. There are no fevers. The patient's wife states that the patient "does not complain and he is not a complainer type" so at this point review of systems is very limited.  Here he has communicative, but appears to be confused. He is awake, alert and oriented x 2 and denies having any pain or shortness of breath, but states has some runny nose.   PAST MEDICAL HISTORY:  1.  Diabetes. 2.  Hypertension. 3.  Stroke. 4.  Recent complex partial seizures, on Keppra.  5.  COPD. 6.  Diabetes.   ALLERGIES: No known drug allergies.   SOCIAL HISTORY: He used to be a smoker but quit 20 years ago, in addition to quit drinking alcohol. No drug use. Lives with his wife.  FAMILY HISTORY: Both parents had high blood pressure,  per chart.   OUTPATIENT MEDICATIONS:  1.  Clopidogrel 75 mg in the morning. 2.  Crestor 10 mg daily. 3.  Doxazosin 8 mg in the morning. 4.  Fish oil 1000 mg once a day. 5.  Hydralazine 100 mg 3 times a day. 6.  Lantus 6 units once a day at bedtime as needed depending on blood sugar. 7.  Keppra 250 mg 3 times a week after dialysis Monday, Wednesday and Fridays and 500 mg Keppra once a day. 8.  Metoprolol 50 mg 2 times a day. 9.  Multivitamin 1 tab daily. 10.  Sertraline 50 mg once a day. 11.  Ventolin 1 puff 4 times a day as needed for wheezing. 12.  Vitamin D2 50,000 international units once a week.  13.  Vitamin D3 1000 units once a day.   REVIEW OF SYSTEMS:  Unable to obtain as the patient is unable to provide a review of systems and history.   REVIEW OF SYSTEMS: VITAL SIGNS: Temperature on arrival 98, pulse rate 65, respiratory rate 20, blood pressure 104/48, last blood pressure 118/52 and O2 sat 100% on room air although O2 sat has gone as low as 89% on room air, per document.  GENERAL: The patient is a well-developed [place] male lying in bed, somewhat sluggish and slow to respond, but no obvious distress.  HEENT: Normocephalic, atraumatic. Injected sclerae. Pupils are equal and reactive. Possible cataract on the left. Extraocular muscles intact. Moist  mucous membranes.  NECK: Supple. No thyroid tenderness. No cervical lymphadenopathy.  HEART:  S1, S2 regular rate and rhythm. No significant murmurs, rubs or gallops.  LUNGS: Clear to auscultation without wheezing, rhonchi or rales.  ABDOMEN: Soft, nontender, nondistended. Positive bowel sounds in all quadrants. EXTREMITIES:  Left upper:  There is good bruit, a little warm around the site of the AV fistula. No significant lower extremity edema.  NEUROLOGIC: Cranial nerves II through XII appear to be grossly intact. Strength is 5 out of 5 in all extremities. Sensation is intact to light touch.  PSYCHIATRIC: The patient is  cooperative, pleasant. He is able to show me 3 fingers on the right hand if I ask him, but he is sluggish and a little slow. He is awake and oriented x 2 to place and year, but not to month and to President.  SKIN: No obvious rashes.   LABORATORY AND DIAGNOSTICS: X-ray of the chest, 1 view, showed no evidence of CHF.  Cannot exclude subsegmental atelectasis or developing pneumonia in the left lung base.  Glucose 143, BUN 37, creatinine 8.33, sodium 137, potassium 4. LFTs shows albumin of 3 and ALT of 10, otherwise within normal limits. Troponin negative. WBC 7.5, hemoglobin 11 and platelets 238. UA:  Positive 2+ plus leukocyte esterase, 115 WBC, 2 bacteria.   EKG: Normal sinus rhythm, rate is 66.  No acute ST changes.   ASSESSMENT AND PLAN: We have a 81 year old African American male with end-stage renal disease on dialysis, recent diagnosis of complex partial seizures on Keppra, diabetes and history of stroke who comes in with episodes of confusion and possible shaking in hands per wife.  Of note, he had a hospitalization and got discharged earlier in month which was deemed secondary to postictal confusion. It is possible he is having seizures on Keppra, but that is not for certain as all of his symptoms could be postictal state. I would order an EEG and obtain a neurology consult. Of note, Dr. Ellsworth Lennox had wanted the patient to be admitted for evaluation by a neurologist. We would obtain orthostatic vitals and defer imaging as he appears to be neurologically intact and recently had several CAT scans and a MRI. We would admit him to a tele bed. He possibly had a syncopal episode. Of note, there was a recent ultrasound and an echocardiogram and those will be deferred.  We would monitor him on the telemetry and follow up with neurology. We would place a nephrology consult for resumption of dialysis. He does not appear to have any evidence for sepsis. He has no fever or leukocytosis. Although he has a  positive urinalysis, he is a dialysis patient and I doubt that is a good urinalysis. Urine cultures have been sent, and the patient has received a dose of ceftriaxone already. We would follow with the urine cultures. I would hold insulin for now and start him on sliding scale insulin as needed and check a Hemoglobin A1c.  I would hold his blood pressure medications as blood pressure is a little on the lower side. X-ray of the chest was un-diagnostic. I would order a PA and lateral for the morning after dialysis. Start him on heparin for deep vein thrombosis prophylaxis, do frequent neuro checks and place him on seizure precautions. The patient is FULL CODE.   TOTAL TIME SPENT:  60 minutes.  ____________________________ Krystal Eaton, MD sa:sb D: 11/17/2012 15:09:47 ET T: 11/17/2012 15:43:01 ET JOB#: [id number]  cc: Krystal Eaton, MD, <Dictator> Marland Mcalpine  Rolene CourseA. Tejan-Sie, MD Krystal EatonSHAYIQ Peytyn Trine MD ELECTRONICALLY SIGNED 12/04/2012 11:36

## 2014-08-24 NOTE — Op Note (Signed)
PATIENT NAME:  Benjamin Freeman, [name] MR#:  [id number] DATE OF BIRTH:  November 20, 1931  DATE OF PROCEDURE:  08/04/2012   PREOPERATIVE DIAGNOSIS: End-stage renal disease requiring hemodialysis.   POSTOPERATIVE DIAGNOSIS:  End-stage renal disease requiring hemodialysis.    PROCEDURE PERFORMED: Insertion of right IJ cuff tunneled dialysis catheter with ultrasound and fluoroscopic guidance.   SURGEON: Renford DillsGregory G Elene Downum, M.D.   ANESTHESIA:  Conscious sedation. Versed 3 mg plus fentanyl 50 mcg administered IV. Continuous ECG, pulse oximetry and cardiopulmonary monitoring is performed throughout the entire procedure by the interventional radiology nurse.   TOTAL SEDATION TIME: 45 minutes.   ACCESS: Right IJ.   FLUOROSCOPY TIME: Less than 1 minute.   CONTRAST USED: None.   INDICATIONS: Mr. Lazarus SalinesLott is a 81 year old gentleman, who is now requiring hemodialysis. He is therefore undergoing placement of a tunnel catheter so that dialysis can be initiated, as his fistula has not yet matured adequately for access. The risks and benefits were reviewed. All agree to proceed.   DESCRIPTION OF PROCEDURE: The patient is taken to special procedures and placed in the supine position. After adequate sedation is achieved, the right neck and chest wall are prepped and draped in sterile fashion. Ultrasound is placed in a sterile sleeve. Ultrasound is utilized secondary to lack of appropriate landmarks and to avoid vascular injury. Images recorded for the permanent record. Jugular vein is echolucent and compressible and direct puncture is made with a Seldinger needle. J-wire is advanced under fluoroscopic guidance into the inferior vena cava. Counterincision is made and dilators are passed over the wire dilator and peel-away sheath is inserted. Dilator and wire are removed and a 19 cm tip-to-cuff catheter is then advanced through the peel-away sheath. The peel-away sheath is removed. The catheter is approximated to the chest wall.  Exit site is selected. A small incision is made, a tunneling device and dilator are passed subcutaneously and the catheter is pulled through the tunnel. Under fluoroscopic guidance, the tips are positioned at the atriocaval junction and the hub is then connected. Both lumens aspirate and flush easily. 5000 units of heparin per lumen is then instilled and the catheter is secured to the chest wall with 0 silk. Neck counterincision is closed with 4-0 Monocryl and Dermabond. The patient tolerated the procedure well and there were no immediate complications.    ____________________________ Renford DillsGregory G. Brenda Cowher, MD ggs:cc D: 08/04/2012 18:22:27 ET T: 08/04/2012 19:08:22 ET JOB#: [id number]  cc: Renford DillsGregory G. Aravind Chrismer, MD, <Dictator> Munsoor Lizabeth LeydenN. Lateef, MD Renford DillsGREGORY G Olayinka Gathers MD ELECTRONICALLY SIGNED 09/12/2012 13:41

## 2014-08-24 NOTE — H&P (Signed)
PATIENT NAME:  Benjamin Freeman, Benjamin Freeman MR#:  [id number] DATE OF BIRTH:  06/13/1931  DATE OF ADMISSION:  08/02/2012  PRIMARY CARE PHYSICIAN: [facility]  CHIEF COMPLAINT: Weakness, lethargy and altered mental status.   HISTORY OF PRESENT ILLNESS: This is a 81 year old male who presents to the hospital due to weakness, lethargy and altered mental status. As per the wife, the patient has been more lethargic, more weak, has no energy, has had poor p.o. intake and also has been more and more confused over the past few days. The patient has a history of underlying chronic kidney disease and went to see his nephrologist. He had some routine blood work done from his nephrologist, which showed that his renal function was progressively getting worse and his GFR was down to less than 10. She referred him to the ER for further evaluation for possible need for hemodialysis. The patient's BUN presently is 80, creatinine is close to 9. He does appear to be somewhat encephalopathic. His GFR is less than 10 presently in the ER. He is being admitted for a possible initiation of hemodialysis.   REVIEW OF SYSTEMS: CONSTITUTIONAL: No documented fever. Positive weight loss, about 10 to 15 pounds over the past few weeks. No weight gain.  EYES: No blurred or double vision.  ENT: No tinnitus. No postnasal drip. No redness of the oropharynx.  RESPIRATORY: No cough, no wheeze. No hemoptysis, no dyspnea.  CARDIOVASCULAR: No chest pain, no orthopnea, no palpitations or syncope.  GASTROINTESTINAL: Positive nausea. No vomiting. No diarrhea. No abdominal pain, no melena, no hematochezia.  GENITOURINARY: No dysuria or hematuria.  ENDOCRINE: No polyuria or nocturia. No heat or cold intolerance.  HEMATOLOGIC: No anemia, no bruising, no bleeding.  INTEGUMENTARY: No rashes. No lesions.  MUSCULOSKELETAL: No arthritis, no swelling, no gout.  NEUROLOGIC: No numbness or tingling. No ataxia. No seizure-type activity.  PSYCHIATRIC: No  anxiety, no insomnia, no ADD.   PAST MEDICAL HISTORY: Consistent with diabetes, hypertension, history of previous cerebrovascular accident, history of stage IV.    ALLERGIES: No known drug allergies.   SOCIAL HISTORY: Used to be a smoker, quit about 20+ years ago, also used to drink quit about 20+ years ago. No IV drug abuse. Lives at home with his wife.   FAMILY HISTORY: Both mother and father had a history of high blood pressure, but no other chronic disease is noted. Mother died at the age of 63, father at 12.   CURRENT MEDICATIONS: Are as follows:  Tylenol with hydrocodone 5/325 mg 1 tab q. 4 hours as needed, amlodipine 10 mg daily, Plavix 75 mg daily, Crestor 10 mg daily, doxazosin 8 mg daily, fish oil 1000 mg daily, hydralazine 100 mg t.i.d., Lantus 20 units at bedtime, metoprolol tartrate 25 mg b.i.d., vitamin D3 1000 international units daily, sodium bicarbonate 650 mg b.i.d.   PHYSICAL EXAMINATION ON ADMISSION: Is as follows:   VITAL SIGNS:  Temperature 98.2, pulse 98, respirations 18, blood pressure 180/61, sats 97% on room air.  GENERAL: He is a pleasant appearing male in no apparent distress.  HEENT: Atraumatic, normocephalic. Extraocular muscles are intact. Pupils are equal and reactive to light. Sclerae is anicteric. No conjunctival injection. No pharyngeal erythema.  NECK: Supple. There is no jugular venous distention. No bruits, no lymphadenopathy or thyromegaly.  HEART: Regular rate rhythm. No murmurs, rubs or clicks.  LUNGS: Clear to auscultation bilaterally. No rales, rhonchi, no wheezes.  ABDOMEN: Soft, flat, nontender, nondistended. Has good bowel sounds. No hepatosplenomegaly appreciated.  EXTREMITIES:  No evidence of any cyanosis, edema or peripheral edema.  He has +2 pedal and radial pulses bilaterally. He does have a left upper extremity AV fistula which has a good bruit and good thrill.  NEUROLOGICAL: He is alert, awake, and oriented x 1. Moves all extremities  spontaneously. No other focal motor or sensory deficits appreciated bilaterally.  SKIN: Moist and warm with no rashes appreciated.  LYMPHATIC: There is no cervical or axillary lymphadenopathy.   LABORATORY, DIAGNOSTIC, AND RADIOLOGICAL DATA: Serum glucose 253, BUN 80, creatinine 9.8, sodium 144, potassium 4.7, chloride 114, bicarbonate 20. LFTs within normal limits. White cell count is 5.6, hemoglobin 7.6, hematocrit 23.1, platelet count 240. Urinalysis shows 1+ blood, negative nitrites and leukocyte esterase, 7 white cells.   ASSESSMENT AND PLAN: This is a 81 year old male with a history of chronic kidney disease stage IV, diabetes, hypertension, benign prostatic hypertrophy, history of coronary artery disease, history of previous cerebrovascular accident, who presents to the hospital with weakness, lethargy and confusion. Recently seen by his nephrologist and noted to have worsening renal failure progressing to end-stage renal disease likely requiring hemodialysis now.  1.  Weakness, lethargy and altered mental status. I suspect this is all related to his uremic encephalopathy. The patient's BUN has been progressively getting worse, and this is what leading to his symptoms. He already has underlying chronic kidney disease stage IV and has now progressed to probably chronic kidney disease stage V requiring hemodialysis. I will go ahead and get a nephrology consult and also vascular consult for a PermCath placement and for initiating dialysis and follow his mental status closely.  2.  Chronic kidney disease, stage IV. This has now progressed to chronic kidney disease, stage V as the patient is symptomatic with uremic encephalopathy, likely requiring hemodialysis. We will get vascular and nephrology consult as stated above.  3. Diabetes. Continue Lantus, continue sliding scale insulin. Follow blood sugars, place him on  carb -controlled diet.  4. Hypertension, presently hemodynamically stable. Continue  Norvasc, hydralazine and metoprolol.  5.  History of previous cerebrovascular accident. Continue Plavix.  6.  History of coronary artery disease currently no chest pain. Continue Plavix, beta blocker and statin.  7.  Benign prostatic hypertrophy. Continue Flomax.   CODE STATUS: The patient is a full code.   TIME SPENT ON ADMISSION:  50 minutes    ____________________________ Rolly PancakeVivek J. Cherlynn KaiserSainani, MD vjs:cc D: 08/02/2012 17:35:22 ET T: 08/02/2012 18:44:24 ET JOB#: [id number]  cc: Rolly PancakeVivek J. Cherlynn KaiserSainani, MD, <Dictator> Houston SirenVIVEK J Kaiden Pech MD ELECTRONICALLY SIGNED 08/03/2012 21:19

## 2014-08-24 NOTE — Discharge Summary (Signed)
PATIENT NAME:  Benjamin Freeman, Benjamin Freeman MR#:  [id number] DATE OF BIRTH:  09-17-1931  DATE OF ADMISSION:  11/17/2012 DATE OF DISCHARGE:  11/21/2012  DISCHARGE DIAGNOSES:  1.  Postictal confusion. 2.  Seizure disorder.  3.  End-stage renal disease. 4.  Cerebrovascular disease. 5.  History of multiple strokes. 6.  Coronary artery disease. 7.  Type 2 diabetes mellitus.   PROCEDURES:  Electroencephalogram.   CONSULTATIONS: Dr. Cristopher PeruHemang Shah, neurology; Dr. Mosetta PigeonHarmeet Singh, nephrology.  ADMITTING PHYSICIAN:  Krystal EatonShayiq Ahmadzia, MD  PRIMARY CARE PHYSICIAN:  Bluford MainSheikh Tejan-Sie, MD  HOSPITAL COURSE: This 81 year old male was admitted through the Emergency Room after collapse at his primary care physician'Brittay Mogle office namely exhibiting altered mental status and confusion. The patient was markedly confused, but did not exhibit any tonic-clonic movements. Please refer to the history and physical for full details. Consultation was placed with Dr. Cristopher PeruHemang Shah who attributed his confusion to a postictal event following his complex partial seizure. His medications were adjusted, namely the dose of his Keppra was increased. The patient'Lenoard Helbert confusion gradually resolved, and he became oriented to his baseline. The patient also has end stage renal disease and underwent hemodialysis in house without any complications. The patient'Philana Younis hospital stay was otherwise uncomplicated.   DISCHARGE MEDICATIONS: Please refer to discharge medical reconciliation form for full list of discharge medications.   DISCHARGE DIET:  Renal, ADA.   DISCHARGE FOLLOWUP:  With Dr. Ellsworth Lennoxejan-Sie in 1 to 2 weeks and with Dr. Cristopher PeruHemang Shah in 1 to 2 weeks.   DISCHARGE ACTIVITY: As tolerated.  ____________________________ Silas FloodSheikh A. Ellsworth Lennoxejan-Sie, MD sat:sb D: 12/05/2012 13:53:00 ET T: 12/05/2012 14:49:36 ET JOB#: [id number]  cc: Sheikh A. Ellsworth Lennoxejan-Sie, MD, <Dictator> Charlesetta GaribaldiSHEIKH A TEJAN-SIE MD ELECTRONICALLY SIGNED 12/07/2012 13:07

## 2014-08-24 NOTE — Discharge Summary (Signed)
PATIENT NAME:  Benjamin Freeman, Benjamin Freeman MR#:  [id number] DATE OF BIRTH:  1932-03-22  DATE OF ADMISSION:  09/03/2012  DATE OF DISCHARGE:  09/04/2012  ADMITTING DIAGNOSIS: Transient ischemic attack versus cerebrovascular accident.  ADDITIONAL DIAGNOSES:   1.  Likely complex partial seizures secondary to prior infarct.  2.  Altered mental status, likely postictal. Now back to baseline.  3.  History of hypertension. 4.  End-stage renal disease with hemodialysis Mondays, Wednesdays, Fridays.  5.  Diabetes mellitus. 6.  History of stroke in the past in left frontal area.   DISCHARGE CONDITION: Stable.   DISCHARGE MEDICATIONS: The patient is to resume his outpatient medications, which are:  1.  Doxazosin 8 mg p.o. daily. 2.  Hydralazine 100 mg p.o. 3 times daily. 3.  Metoprolol tartrate 25 mg p.o. twice daily. 4.  Sodium bicarbonate 650 mg p.o. twice daily. 5.  Lantus insulin 6 units subcutaneously at bedtime.  6.  Vitamin D3, 1000 units once daily.  7.  Amlodipine 10 mg p.o. daily.  8.  Clopidogrel 75 mg p.o. daily. 9.  Crestor 10 mg p.o. daily.  10.  Vitamin D2, 50,000 units once a day as per prior recommendations.  11.  Fish oil 1 gram once daily.  12.  Sertraline 25 mg p.o. daily.  13.  Anoro Ellipta 62.5 mcg/25 mcg inhalation powder 1 puff once daily.  14.  Ventolin HFA 1 puff 4 times daily as needed.  15.  Keppra 500 mg p.o. once daily.  16.  Keppra 250 mg 3 times weekly after each hemodialysis session on Mondays, Wednesdays and Fridays.   HOME OXYGEN:  None.  DIET: 2 grams salt, (Dictation Anomaly) low fat, low cholesterol, carbohydrate-controlled hemodialysis diet, diet consistency regular.   INSTRUCTIONS: The patient was advised not to drive for approximately 6 months after suspected seizure episode. He was also advised to avoid risky situations such as climbing on the roof, swimming alone, etc. He is to follow up with Dr. Yves Dill in 2 days after discharge. He is to follow up with  [facility] in 2 days after discharge, as well as to finish his outpatient workup such as MRI of his brain as well as electroencephalogram.   CONSULTANTS:  Neurologist, Dr. Stevie Kern 09/04/2012. Also nephrologist, Dr. Cherylann Ratel and care management.   RADIOLOGIC STUDIES:  Chest PA and lateral 09/02/2012 showed mild CHF, central catheter being in good place. CT scan of head without contrast 09/02/2012 revealed no acute intracranial process. The patient was noted to have old left frontal infarct with encephalomalacia and  generalized cerebral atrophy. Periventricular white matter low attenuation was likely secondary to microangiopathy. Ultrasound of carotid arteries bilateral 09/03/2012 revealed no evidence of hemodynamically significant carotid stenosis. Echocardiogram done on 09/03/2012 showed left ventricular ejection fraction by visual estimation 55% to 60%, normal global left ventricular systolic function, moderate left ventricular hypertrophy, mildly dilated left atrium, trivial pericardial effusion, mild mitral valve regurgitation, moderately increased left ventricular posterior wall thickness, mild tricuspid regurgitation. Read by Dr. Darrold Junker.  HISTORY:  The patient is a 81 year old African-American male with past medical history significant for history of stroke approximately 2 decades ago that left him with no significant abnormality, presented to the hospital with episode of confusion. Apparently patient had an episode when his eyes rolled back and then his right hand was contracted. He was very confused, and it lasted approximately half an hour. He also had an episode approximately 2 months ago when he did not seek any medical care. On  arrival to the hospital, patient's vital signs showed temperature of 97.8, pulse was 81, respiratory rate was 20, blood pressure 114/44, saturation was 98% on oxygen therapy. Physical exam was unremarkable.   LAB DATA:  Done on day of admission, 09/03/2012,  revealed BUN and creatinine of 24 and 5.27 respectively, glucose 145, sodium 135, potassium 3.1, otherwise BMP was unremarkable. The patient's liver enzymes revealed albumin level of 2.9. Cardiac enzymes x 3 were unremarkable. The patient's white blood cell count was normal at 10.0, hemoglobin was 9.8 and platelet count 281. Coagulation panel was normal.  EKG was done in the Emergency Room; however, results were not available.   HOSPITAL COURSE:  The patient was admitted to the hospital with diagnosis of acute episode of confusion, speech changes, as well as changes in extremity tone, questionable TIA versus seizure. He was admitted to telemetry. His radiologic studies were ordered as well as electroencephalogram and MRI of his brain Unfortunately, because of the weekend, we were not able to perform MRI of the brain or electroencephalogram, and that needs to be postponed to outpatient evaluation. The patient, however, was evaluated by  neurologist on call, Dr. Stevie Kernrista Swisher, who saw patient in consultation on 09/04/2012. She felt that patient very likely has likely complex partial seizures secondary to prior infarct. She recommended to give patient one dose of Keppra IV, and then to start patient on Keppra 500 mg p.o. daily with extra 250 mg after each hemodialysis session. She also informed patient that patient is not allowed to drive per state law of [place], that he has obligation to inform the Department of Motor Vehicles that he had seizure. He may resume driving if he is seizure-free after 6 months or if he is cleared by a neurologist and Department of Motor Vehicles. She recommended that patient see a neurologist in the next 2 weeks and get an outpatient MRI and electroencephalogram.   On the day of discharge, patient (Dictation Anomaly) fe,lt well,  did not complain of any significant discomfort. He is being discharged with the above-mentioned medications and followup. His vital signs on the  day of discharge: Temperature 98.4, pulse was 78, respiratory rate was 20, blood pressure 108/54, saturation 93% on room air at rest. Of note, patient was noted to have one episode of hypoxia while on room air at rest on 09/04/2012. His O2 sats were  reportedly at 84%. However, no other episodes of hypoxia were noted in the hospital, so it was unclear if, in fact, this was just an error reading. It is recommended, however, to check patient's oxygen readings as outpatient and make decisions about investigating him for hypoxia. In regards to other medical issues such as end-stage renal disease, hemodialysis, also diabetes and hypertension, or history of stroke, patient is to continue his outpatient management.   TIME SPENT: 40 minutes.     ____________________________ Katharina Caperima Sava Proby, MD rv:mr D: 09/04/2012 17:23:00 ET T: 09/04/2012 20:46:55 ET JOB#: [id number]  cc: Margaretann LovelessNeelam S. Khan, MD [facility] Katharina Caperima Tamara Kenyon, MD, <Dictator>     Jamilette Suchocki MD ELECTRONICALLY SIGNED 09/20/2012 7:03

## 2014-08-24 NOTE — Consult Note (Signed)
PATIENT NAME:  Benjamin Freeman, Benjamin Freeman MR#:  [id number] DATE OF BIRTH:  December 23, 1931  DATE OF CONSULTATION:  11/18/2012  REFERRING PHYSICIAN:  Dr. Jacques Navy  CONSULTING PHYSICIAN:  Benjamin Straw K. Sherryll Burger, MD  PRIMARY CARE PHYSICIAN:  Dr. Ellsworth Lennox.  REGULAR NEUROLOGIST:  Dr. Leandro Reasoner.  REASON FOR CONSULTATION:  Acute stroke.   HISTORY OF PRESENT ILLNESS:  Benjamin Freeman is a 81 year old African American gentleman who seemed to have his first seizure back in March of 2014.  In fact, family initially did not recognize that as a seizure.   During his typical spell, he might start having tremulousness of his hands.  He stops responding.  He stares.  He might look to his right.  He might have right deviation of his head.   This might last for 3 to 5 minutes, afterwards he becomes very sleepy, confused and "passed out."   Initially used to have them very infrequently, once every 3 to 4 weeks, but he had an episode in May of 2014 and was seen by Dr. Mont Dutton who felt these events were seizure-like event and recommended outpatient work-up and started him on levetiracetam during the daytime every day and after each dialysis.   Wife has mentioned that since then he continued to have increased frequency of seizure.   He was actually hospitalized in late June/early July for the same thing.   Most recently he went to see Dr. Malvin Freeman and in the waiting area the patient had slumped over, unresponsive and feeling dizzy and not himself before that.   The patient was taken to the ER.   His wife mentioned that he had his typical seizure-like event even before this spell.   Wife mentioned that he might have medication noncompliance as she takes out his medications and puts it in front of him on the table and sometimes she finds the medications lying on the floor.   The patient is not quite sure if he takes all his medications on a regular basis.   The patient has had an EEG this morning which showed no significant  epileptiform activity.   The patient also had an MRI of brain which has shown a left frontal encephalomalacia with extensive white matter microvascular ischemic changes and multiple lacunar infarcts in his cerebellum.   The patient, other than speech problem, does not have any other focal neurological deficit from his infarct except cognitive issues.   The patient and his wife mentioned that patient has been having some cognitive issues that he speaks very less often.  Forgets the day and date.  He is not able to take care of his finances and wife has taken over.  They could not tell me exactly for how long.   Wife has taken over taking care of his medications.   PAST MEDICAL HISTORY:  Significant for diabetes, hypertension and stroke, history of recent complex partial seizures, COPD, diabetes.   ALLERGIES:  He does not have any known drug allergies.   SOCIAL HISTORY:  Significant that he used to be a smoker, quit around 20 years ago.  He also quit drinking alcohol.  Does not do drugs.  He lives with his wife.   FAMILY HISTORY:  Significant that both parents had high blood pressure.   MEDICATIONS:  I have reviewed his home medication list.   REVIEW OF SYSTEMS:  Positive for recent seizure, not feeling well, left arm a little bit too warm than the right.   The rest of a 10  system review of system was asked and was found to be negative.   PHYSICAL EXAMINATION: GENERAL:  He is an ill-looking African American gentleman lying in bed, not in acute distress, surrounded by family members.  He did not know current date, year or the month.  He was able to follow one-step commands very well, but had some hard time with two-step inverted commands.  He took some time to tell me his daughter's name, he is able to tell me his wife's name.  He was able to tell me his address, but could not tell me the zip code.  The family stated the address was not quite correct.  On his attention and concentration  seems to be reduced.  The patient has decreased fluency of his speech.  On his cranial nerves, his pupils are equal, round and reactive.  Extraocular movements were saccadic.  His visual field was hard to test.  His face was symmetric.  Tongue was midline.  Facial sensations were intact.  He also has some frontal lobe release signs such as palmomental reflex.  On his motor examination, he has a mildly increased tone versus paratonia, but his strength seems to be 5 out of 5.  His reflexes were symmetric.  He has decreased ankle jerk.  He has decreased sensation to light touch in his feet.  He does have pressure on his left upper extremity and his left forearm did feel warmer than the right.   ASSESSMENT AND PLAN:   1.  Seizure (complex partial seizures with secondary generalization) the patient's description of the event, this does sound like epileptic phenomena and the patient has postictal confusion.  The patient's wife and other family member had lots of questions about seizure, so I explained the etiology, pathogenesis, progression and natural history of the seizure and why some patients might not have a motor behavior with each seizure.  I explained them the cognitive impairment, the cause of cognitive impairment in patients with postictal confusion, etc.  They seemed to understand the importance of taking antiepileptic medications.  I talked to them about seizure precautions such as not driving for at least six months after his last seizure and being very careful about climbing a ladder, etc.  I talked to them about first-aid for the seizure, referred them to the video on epilepsy.com, etc. 2.  In terms of his medications, we will increase his every day levetiracetam to 750 mg once a day and he will get an additional dose of 500 mg after his dialysis.   The patient seems to have increased frequency of his seizure after his dialysis which might be because of the medication is being cleared out.   I do not see the need to repeat any other neuroimaging at present as patient just had a recent MRI of the brain done in 11/02/2012.  2.  Previous strokes.  The patient does seem to have his first stroke in 1980.  Since then his speech has been affected.  Family member also mentioned that he might have had other stroke since then.  Part of his MRI of the brain, the patient has a left middle cerebral artery infarct involving the left frontal lobe close to frontal lobe.  It seems to be causing him some expressive aphasia.  The patient also has extensive white matter microvascular ischemic changes and other multiple lacunar infarcts especially in his bilateral cerebellum.  Specifically, there is one bigger one into his right superior cerebellar artery territory.  This  all seems to be a small vessel disease.  The patient is already on Plavix.  He should be considered on a statin.  3.  Possible vascular dementia.  The patient is not being oriented at baseline, had a hard time telling me his daughter's name.  His inability to manage his finances and medication.  I think he does have some underlying cognitive impairment, with his current cognitive I am not sure whether there is some component of postictal confusion, so I think he should have an outpatient evaluation when he is at optimal cognitive status and then should be considered on other cognitive medications.   Unfortunately, neurology coverage is not available over this weekend, but feel free to contact me with any further questions on this gentleman.  I will inform about his hospitalization to the patient's primary neurologist Benjamin Freeman.    ____________________________ Durene CalHemang K. Sherryll BurgerShah, MD hks:ea D: 11/18/2012 22:01:18 ET T: 11/18/2012 23:38:55 ET JOB#: [id number]  cc: Ileana Chalupa K. Sherryll BurgerShah, MD, <Dictator> Sheikh A. Ellsworth Lennoxejan-Sie, MD Paulita FujitaZachary E. Benjamin JohnsPotter, MD Durene CalHEMANG K [facility] MD ELECTRONICALLY SIGNED 11/28/2012 7:54

## 2014-08-24 NOTE — Discharge Summary (Signed)
PATIENT NAME:  Benjamin Freeman, [name] MR#:  [id number] DATE OF BIRTH:  09/24/1931  DATE OF ADMISSION:  10/30/2012 DATE OF DISCHARGE:  11/02/2012  DISCHARGE DIAGNOSES:   1.  Encephalopathy due to possible postictal confusion.  2.  Seizure disorder, transitional disease.  3.  History of cerebrovascular disease.  4.  Chronic obstructive pulmonary disease.   DISCHARGE MEDICATIONS: No change in discharge medications.  Please refer to medical reconciliation.   PROCEDURES:  1.  CT scan of the brain.  2.  MRI of the brain.   HOSPITAL COURSE: This elderly male with history of a seizure disorder was admitted through the Emergency Room after presenting with altered mental status, according to his wife. The patient was not particularly oriented to time, place and person without any focal deficits. Please read history and physical for full details. Initial CT scan of the brain was negative for acute infarct or hemorrhage. He was admitted to the medical floor and his mental status gradually improved. By the time of discharge, he was oriented to time, place and person. I ordered an MRI which was also negative for an acute infarct. Neurology consultation was unavailable this week. Presumptive diagnosis was postictal confusion from one of his complex partial seizures. The patient'Deloma Spindle hospital stay was otherwise uncomplicated. He underwent hemodialysis as scheduled uneventfully.   The patient was discharged home in satisfactory condition.   DISCHARGE INTSRUCTIONS:   DIET: Renal diet, low cholesterol.   ACTIVITY: As tolerated.   FOLLOW-UP: With Dr. Ellsworth Lennoxejan-Sie in 1 to 2 weeks and maintain neurology follow-up.   ____________________________ Silas FloodSheikh A. Ellsworth Lennoxejan-Sie, MD sat:cc D: 11/02/2012 13:45:00 ET T: 11/02/2012 14:03:23 ET JOB#: [id number]  cc: Marland McalpineSheikh A. Ellsworth Lennoxejan-Sie, MD, <Dictator> Charlesetta GaribaldiSHEIKH A TEJAN-SIE MD ELECTRONICALLY SIGNED 11/10/2012 12:57

## 2014-08-24 NOTE — H&P (Signed)
PATIENT NAME:  Benjamin Freeman, [name] MR#:  [id number] DATE OF BIRTH:  1931/10/08  DATE OF ADMISSION:  04/23/2013  REFERRING PHYSICIAN: Dr. Shaune PollackLord.    PRIMARY CARE PHYSICIAN: Dr. Ellsworth Lennoxejan-Sie.   PRIMARY NEPHROLOGY: [facility].   CHIEF COMPLAINT: Lethargy, weakness, altered mental status.   HISTORY OF PRESENT ILLNESS: This is a 81 year old male with significant past medical history of end-stage renal disease on hemodialysis, seizures, diabetes, hypertension, history of CVA in the past, and, as per wife, history of dementia. Wife reports the patient has been more lethargic over the last 24 hours, did not have good appetite, has not been eating, as well has been has not been ambulating as well. As well, she reports she felt him to have fever at home as well, and to be more confused and lethargic than his baseline. The patient is an extremely poor historian, cannot give any reliable history. Wife was called to obtain  patient's history.  Upon presentation, the patient was found to have fever of 101, as well he has significant leukocytosis at 14,000. The patient's chest x-ray did not have any opacity or infiltrate. As well, per report,  denied him having any cough or productive sputum. His urinalysis was negative as well. The patient's influenza swab was negative as well. The patient had CT head without contrast which did not show any acute finding, it did show only old chronic ischemic changes and CVA. Wife reports the patient has been getting his hemodialysis regularly, and reports he is supposed to get his hemodialysis on Sunday morning, which is not as scheduled due to the holidays where they are closed next Wednesday due to Christmas, so said schedule was supposed to be Sunday, Tuesday and Friday. Hospitalist service were requested to admit the patient for further workup and evaluation.  The patient is known to have history of seizures, but he was not noticed to have any seizure activity, no loss of consciousness, no  urinary or stool incontinence.   PAST MEDICAL HISTORY: 1.  Diabetes mellitus.  2.  End-stage renal disease, on hemodialysis.  3.  Hypertension.  4.  History of CVA.  5.  History of seizures, on Keppra.   6.  History of COPD.     ALLERGIES: No known drug allergies.   SOCIAL HISTORY: The patient quit smoking 28 years ago. No alcohol. No known drug use. Lives at home with his wife.   FAMILY HISTORY: Significant for hypertension.   HOME MEDICATIONS:  1.  Doxazosin 8 mg oral daily.  2.  Keppra 250 mg on dialysis days Monday, Wednesday, Friday. 3   Keppra 500 mg oral daily, Zoloft 50 mg oral daily.  4.  Zoloft 50 mg oral daily. 5.  Lantus 6 units subcutaneous at bedtime.  6.  Crestor 10 mg at bedtime.  7.  Metoprolol tartrate 50 mg oral 2 times a day.  8.  Amlodipine 10 mg oral daily.  9.  Fish oil 1000 mg oral daily.  10.  Hydralazine 100 mg oral 3 times a day.  11.  Rena-Vite, vitamin B complex with Vitamin C and folic acid one tablet oral daily.  12.  Vitamin D3, 1000 international units oral daily.  13.  Vitamin D2 50,000 international units oral weekly.   REVIEW OF SYSTEMS:  The patient is confused, cannot give reliable review of systems review of systems.   PHYSICAL EXAMINATION: VITAL SIGNS: Temperature 99.5, T-max 101.4, pulse 76, respiratory rate 16, blood pressure 119/66, saturating 94% on room air.  GENERAL: Elderly male who looks comfortable in bed, in no apparent distress.  HEENT: Head atraumatic, normocephalic. Pupils equal, reactive to light. Pink conjunctivae. Anicteric sclerae. Moist oral mucosa.  NECK: Supple. No thyromegaly. No JVD.  CHEST: Good air entry bilaterally. No wheezing, rales or rhonchi.  CARDIOVASCULAR: S1, S2 heard. No rubs, murmur or gallops.  ABDOMEN: Soft, nontender, nondistended. Bowel sounds present.  EXTREMITIES: No edema. No clubbing. No cyanosis. Pedal pulses felt bilaterally.   PSYCHIATRIC: The patient communicative, confused. Awake, alert  x 2.  NEUROLOGIC: Cranial nerves grossly intact. Motor 5 out of 5. No focal deficits.  LYMPHATICS: No cervical or supraclavicular lymphadenopathy could be appreciated.  SKIN: Warm and dry.   PERTINENT LABORATORY DATA: Glucose 114, BUN 46, creatinine 8.48, sodium 137, potassium 3.7, chloride 100. Troponin less than 0.02. White blood cells 14.2, hemoglobin 10.9, hematocrit 33, platelets 216.  Urinalysis negative for leukocyte esterase and nitrite.   IMAGING: Chest x-ray no evidence of acute cardiopulmonary disease, stable subpleural reticulation/thrombosis most prominent in the left lower lobe. CT head without contrast: No evidence of acute abnormality, there is atrophy and chronic small vessel white matter ischemic changes and remote left frontoparietal and right cerebellar infarct.   ASSESSMENT AND PLAN: 1.  Systemic inflammatory response syndrome. The patient is febrile with leukocytosis. At this point, source of infection is unclear. Urinalysis is negative. Chest x-ray is clean. He has no upper respiratory infection symptoms. The patient has blood cultures sent, there was also another culture during hemodialysis. We will start patient on broad-spectrum antibiotics, IV vancomycin and Zosyn. The patient does not have any meningeal signs.  2.  Altered mental status. The patient has baseline dementia, but he is more lethargic, more confused than his baseline, has no acute finding of the CT head: No seizure activity were noticed. This is most likely related to his systemic inflammatory response syndrome.  3.   End-stage renal disease. We will consult nephrology to continue patient hemodialysis.  4.   Diabetes. We will have the patient on insulin sliding scale.  5.  Hypertension: We will hold patient's blood pressure medication as his blood pressure on the lower side. If it becomes elevated, we will resume him gradually.  6.  History of cerebrovascular accident. Unclear at this point, why the patient is  not on aspirin and Plavix. We will have this issue addressed by his PCP.  7.   History of seizures. We will continue the patient on Keppra.  8.   History of chronic obstructive pulmonary disease. No wheezing. No respiratory distress.  9.   Deep vein thrombosis prophylaxis. Subcutaneous heparin.  10.   CODE STATUS: Code status is discussed with the wife.  Patient is full code.   Total time spent on admission and patient care: 55 minutes.    ____________________________ Starleen Arms, MD dse:NTS D: 04/23/2013 00:32:06 ET T: 04/23/2013 02:56:32 ET JOB#: [id number]  cc: Starleen Arms, MD, <Dictator> DAWOOD Teena Irani MD ELECTRONICALLY SIGNED 04/26/2013 5:46

## 2014-08-24 NOTE — Consult Note (Signed)
PATIENT NAME:  Benjamin Freeman, Benjamin Freeman MR#:  [id number] DATE OF BIRTH:  [birth year]  NEUROLOGY CONSULTATION  DATE OF ADMISSION:  09/02/2012 DATE OF CONSULTATION:  09/04/2012  REASON FOR CONSULTATION:   Seizure.  REQUESTING PHYSICIAN: Dr. Auburn Bilberry.  HISTORY OF PRESENT ILLNESS: This is a 81 year old African American male with a past medical history of end-stage renal disease, recently started on hemodialysis, diabetes, hypertension, a previous stroke, who was admitted on 09/02/2012 after an episode of altered mental status, right upper extremity contractions, confusion, and eye-rolling. The event lasted for approximately half an hour. He had a similar event that happened 2 months ago but did not seek medical attention at that time. When he was in the Emergency Department he returned back to his baseline. He did feel weak and tired afterwards.   REVIEW OF SYSTEMS: A complete 14-point review of systems  is negative other than what is mentioned in the HPI.   PAST MEDICAL HISTORY: Diabetes, hypertension, end-stage renal disease, on hemodialysis; prior stroke.   ALLERGIES: None.   HOME MEDICATIONS: Amlodipine, Ellipta, Plavix, Crestor, doxazosin, fish oil, hydralazine, Lantus, metoprolol, sertraline, sodium bicarbonate, Ventolin, vitamin D.   SOCIAL HISTORY: He is a smoker and quit 20 years ago. Previous alcohol abuse as well. He lives at home with his wife.   FAMILY HISTORY: Both father and mother had high blood pressure, but no history of chronic kidney disease.   PHYSICAL EXAMINATION: VITAL SIGNS: Temperature 98.7, pulse 84, respirations 20, blood pressure 123/61, pulse ox 84% on room air.   GENERAL: Supine, well, in no apparent distress.  CARDIOVASCULAR: Regular rate and rhythm.  RESPIRATORY: Clear anteriorly.  ABDOMEN: Soft.  SKIN: No rashes or lesions.  MENTAL STATUS: Awake, alert, and oriented x 4. Naming and repetition intact. Speech is fluent. No dysarthria.  CRANIAL NERVES: Pupils  equal, round, and reactive to light. Full visual fields. Extraocular movements intact. No facial or sensory deficits. Facial expression symmetric. Shoulder-shrug 5/5 bilaterally. Tongue midline.  MOTOR: No pronator drift. Strength is 5/5 throughout.  SENSATION: No deficits to light touch throughout.  COORDINATION: Intact to bilateral finger-to-nose.  DEEP TENDON REFLEXES: 1+/4 throughout, with absent ankle jerks bilaterally. Toes are mute.  GAIT: Not assessed.   IMAGING: Noncontrast head CT shows no acute intracranial process. He has an old left-sided infarct with encephalomalacia. There is also generalized atrophy and small-vessel ischemic disease.   Bilateral carotid Dopplers showed no evidence of hemodynamically-significant carotid stenosis.   Chest x-ray: Cannot exclude mild CHF. Central catheter in good position.   LABS: BMP is significant for elevated BUN of 36 and creatinine of 6.58. Otherwise unremarkable.   CBC reveals anemia, with a hematocrit of 27.9, otherwise unremarkable.   LFTs are within normal limits.   Cardiac enzymes are negative.   ASSESSMENT AND PLAN: This is a 81 year old male with a past medical history of left frontal infarct, hypertension, diabetes, and end-stage renal disease on hemodialysis, who presents with likely a complex-partial seizure secondary to prior infarct. He is now back to his baseline.   RECOMMENDATIONS: 1.  Keppra 500 mg daily. He was actually on 250 mg after each dialysis.  2.  Give Keppra 500 mg IV x 1 now.  3.  I informed the patient that he is not allowed to drive per the [facility] law of [place], and he is to inform the [facility] that has had a seizure. He may resume driving if he is seizure-free for  6 months and if he is cleared by his  neurologist as well as the DMV.  4.  Follow up with an outpatient neurologist in 2 weeks.  5.  Will need outpatient MRI and EEG.   Thank you for this consultation.       ____________________________ Sid Falconhrista B. Ayano Douthitt, MD cbs:dm D: 09/04/2012 12:53:35 ET T: 09/04/2012 13:42:55 ET JOB#: [id number]  cc: Diogo Anne B. Alger SimonsSwisher, MD, <Dictator> Trellis PaganiniHRISTA B Lennox Dolberry MD ELECTRONICALLY SIGNED 10/09/2012 13:12

## 2014-08-24 NOTE — H&P (Signed)
PATIENT NAME:  Benjamin Freeman, Benjamin Freeman MR#:  [id number] DATE OF BIRTH:  02/09/1932  DATE OF ADMISSION:  04/17/2012  PRIMARY CARE PHYSICIAN: Dr. Gerarda Fraction and Dr. _____ from [facility].  NEPHROLOGIST:  He has seen nephrology but the  patient does not remember who.  HISTORY OF PRESENT ILLNESS:  The patient is a 81 year old African American male with past medical history significant for history of coronary artery disease, CKD, history of gastroesophageal reflux disease, hypertension, hyperlipidemia, benign prostatic hypertrophy and other medical problems, who presented to the hospital with complaints of brief period of confusion and weakness. The patient, as well as his wife, were in the church at around noon. When she looked back to her husband, her husband was leaning to the side and was kind of leaning on the pew. His eyes were closed. When she approached him, he opened his eyes but he was very somnolent, very sleepy and intermittently was dosing off. He was able to answer her questions that he was alright, he was okay, however, he was not able to respond much more. He was brought to the Emergency Room for further evaluation to evaluate him for this somnolence and weakness and was noted to have acute on chronic renal failure with creatinine of above 5, now at baseline being 2.55 and hospitalist services were contacted for admission.   PAST MEDICAL HISTORY: History of anemia due to CKD, history of mild iron deficiency anemia, history of coronary artery disease,  CKD with baseline creatinine of 2.55 on 04/08/2012; history of gastroesophageal reflux disease; hypertension; hyperlipidemia; BPH; diabetes mellitus, insulin dependent; multiple CVAs; history of 3 mm sigmoid polyp recovered in a colonoscopy in 05/20124 by Dr. Niel Hummer.  ALLERGIES: No known drug allergies.   MEDICATIONS: According to the patient's medication box, the patient is on:  1. Doxazosin 8 mg p.o. daily.  2. Sodium bicarbonate 325  mg p.o. twice daily. 3. Hydralazine 100 mg 3 times daily. 4. Vitamin D 1000 units once daily. 5. Keflex 500 mg p.o. 3 times daily for 2 more doses. 6. Norvasc 10 mg p.o. daily.  7. Crestor 10 mg a day.  8. Plavix 75 mg daily.  9. Metoprolol 25 mg p.o. twice daily.  10. Lantus 20 units at bedtime   SOCIAL HISTORY:  The patient denies any tobacco, alcohol abuse or drug abuse. However, used to smoke up to 2 packs a day for 40 years, quit in 1991.  FAMILY HISTORY:  Significant for diabetes mellitus.   REVIEW OF SYSTEMS:  GENERAL: The patient is admitting to having chills for the past 2 or 3 days, having cough as well as wheezing, some shortness of breath and green phlegm production, some fatigue and weakness. His weight is around 210 pounds. He admits to some blurring of vision that seems to be worse over a period of time now. Also, operated cataracts in the past. He has evidence of cough as mentioned above, wheezing, dyspnea, green phlegm. Evidence of left elbow bursitis with fluid removed approximately a week ago by Dr. Rosita Kea, was started on Keflex; 2 more doses of Keflex are left for him to take. Otherwise, his left elbow swelling, bruises and pain seem to be subsiding. Denies any high fevers, pains, weight loss or gain.   EYES: In regards to eyes, denies any double vision or glaucoma.  EARS, NOSE, THROAT: Denies any tinnitus, allergies, epistaxis, sinus pain, dentures, difficulty swallowing.  RESPIRATORY: Hemoptysis, asthma, COPD. CARDIOVASCULAR: Denies chest pain, orthopnea, edema, arrhythmias, palpitations or syncope.  GASTROINTESTINAL: Denies nausea, vomiting, diarrhea, constipation. Admits of having some left chest rattling, especially whenever he coughs.  GENITOURINARY: Denies dysuria, hematuria, frequency, incontinence. Feels that he empties his bladder quite okay.  ENDOCRINE: Denies any polydipsia, nocturia, thyroid problems, heat or cold intolerance or thirst.  HEMATOLOGY: Denies  anemia, easy bruising, bleeding, swollen glands.  SKIN: Denies any acne, rashes, lesions, change in moles.  MUSCULOSKELETAL: Denies arthritis, cramps, swelling.  NEUROLOGIC: No numbness, epilepsy or tremor.  PSYCHIATRIC: Denies anxiety, insomnia or depression.   PHYSICAL EXAMINATION: VITAL SIGNS: On arrival to the hospital, temperature 97.5, pulse 68, respirations 18, blood pressure 138/66, saturation 98% on room air.  GENERAL: This is a well-nourished African American male leaning to the left side on the stretcher.  HEENT: His pupils are equal, react to light. Extraocular movements intact. No icterus or conjunctivitis. Has normal hearing. No pharyngeal erythema. Mucosa is moist.  NECK: No masses. Supple and nontender. No adenopathy. No JVD or carotid bruits bilaterally. Full range of motion.  LUNGS: Clear to auscultation posteriorly with some diminished breath sounds bilaterally. Posteriorly, however, the patient does have significant rales, rhonchi and crackles. Anteriorly, the patient has no wheezing or labored respirations. He does not have any increased effort, dullness to percussion; however, no overt respiratory distress. He coughs intermittently and he rattles in the chest from this sense. CARDIOVASCULAR: S1, S2 appreciated. No murmurs, gallops or rubs noted. PMI not lateralized. Chest is nontender to palpation. 1+ pedal pulses, 2+ lower extremity edema bilaterally, more on the right lower extremity. No calf tenderness or cyanosis was noted.  ABDOMEN: Soft, minimally uncomfortable to palpation in the left lower quadrant but no rebound or guarding were noted. No hepatosplenomegaly or masses were noted.  RECTAL: Deferred.  MUSCULOSKELETAL: Able to move all extremities, however, very weak. No cyanosis, degenerative joint disease or kyphosis. Gait is not tested.  SKIN: Did not reveal any rashes, erythema, nodularity or induration. Skin was warm and dry to palpation.  LYMPHATIC: No adenopathy in  the cervical region.  NEUROLOGIC: Cranial nerves grossly intact. Sensory is intact. No dysarthria or aphasia. The patient is alert. He is somewhat intermittently somnolent, poorly cooperative. Memory is impaired but _____ confusion, agitation or depression noted.   DIAGNOSTIC DATA:  EKG shows what looks like irregular rhythm, likely atrial fibrillation with a rate of 66 beats per minute, normal axis, nonspecific T-wave abnormality in the lateral leads. No acute ST-T changes, however, were noted. BMP showed BUN and creatinine elevated to 49 and 5.10 as compared to 44 and 2.78 on 04/09/2012. Glucose level elevated at 145. Estimated GFR for African American would be 12, down from 29; otherwise, BMP was unremarkable. The patient's liver enzymes: Albumin level 2.7, otherwise unremarkable. Cardiac enzymes, first set negative. White blood cell count is normal at 10.2, hemoglobin 9.2, platelet count 236. Urinalysis was not done.   RADIOLOGY: Chest x-ray, PA and lateral 04/17/2012 showed _____ cardiopulmonary disease; hyperinflation consistent with COPD is present according to radiologist. CT scan of head without contrast 04/17/2012 revealed old left frontal infarct, changes of atrophy, microvascular ischemic disease, no acute intracranial abnormality evident.   ASSESSMENT AND PLAN: 1. Altered mental status of unclear etiology at this time. Get ABGs to rule out CO2 retention, however, patient's bicarbonate is low at 18. We will follow neurologic status with neurologic exams frequently, rule out infection related to etiology.  2. Suspected atrial fibrillation. Continue the patient on metoprolol. The patient's heart rate is controlled. We will admit patient to telemetry. We  will get cardiologist involved and we will get echocardiogram done.  3. Acute on chronic renal failure. We will start the patient on low-rate intravenous fluids. Will follow patient's urine output. The patient's bladder scan was done and was found  to be low at only 62 mL. We will continue intravenous fluids, will get nephrologist consultation; will also get urinalysis to rule out a urinary tract infection. 4. Acute bronchitis. Start the patient Rocephin as well as Zithromax and get sputum cultures if possible.  5. Acidosis. Continue patient on bicarbonate.  6. Anemia. The patient would benefit from Epogen injection. Of note, the patient's urinalysis just came in; it showed yellow cloudy urine, 50 mg/dL of glucose, negative for bilirubin or ketones, specific gravity 1.023, pH 5.0, negative for blood, more than 500 glucose, negative for nitrites or leukocyte esterase, 5 red blood cells, 6 white blood cells, trace bacteria, less than 1 epithelial cell, mucus is present and 3 hyaline casts as well as amorphous crystal is present. Very likely the patient has urinary tract infection. Will initiate the patient on Rocephin. We will also get urine culture taken.  7. Diabetes mellitus. Will decrease the patient's Lantus dose to 10 units. We will follow the patient's glucose levels with sliding scale insulin. Advance the patient's Lantus as needed.   TIME SPENT: 50 minutes.    ____________________________ Katharina Caperima Dhairya Corales, MD rv:ct D: 04/17/2012 16:05:00 ET T: 04/18/2012 07:24:32 ET JOB#: [id number]  cc: Katharina Caperima Carmela Piechowski, MD, <Dictator> Merlina Marchena MD ELECTRONICALLY SIGNED 05/04/2012 20:36

## 2014-08-24 NOTE — Discharge Summary (Signed)
PATIENT NAME:  Benjamin Freeman, Benjamin Freeman MR#:  [id number] DATE OF BIRTH:  [birth year]  DATE OF ADMISSION:  08/02/2012 DATE OF DISCHARGE:  08/06/2012  PRIMARY CARE PROVIDER:  Mariane MastersMartin Mayer, PA.   DISCHARGE DIAGNOSES:  1.  End-stage renal dialysis being started on new hemodialysis.  2.  Uremic encephalopathy.  3.  Uncontrolled hypertension.  4.  Diabetes mellitus with episodes of hypoglycemia.  5.  Anemia of chronic disease.   CONSULTANTS: Dr. Cherylann RatelLateef of nephrology, Dr. Gilda CreaseSchnier of vascular surgery.   PROCEDURES:  Permanent dialysis catheter in the subclavian vein.   ADMITTING HISTORY AND PHYSICAL:  Please see detailed H and P dictated by Dr. Cherlynn KaiserSainani on 08/02/2012.  In brief, a 81 year old male patient with history of CKD who [facility] presented to the Emergency Room complaining of weakness, lethargy and altered mental status brought in by the wife.  The patient was found to have worsening renal function with uremic encephalopathy needing dialysis admitted to the hospitalist service.   HOSPITAL COURSE:  1.  End-stage renal disease.  The patient had a left upper extremity AV fistula placed as an outpatient, was waiting for his dialysis to be started but with worsening kidney function and uremic encephalopathy was admitted to the hospitalist service.  Nephrology was consulted.  The patient had 3 rounds of dialysis and has been set up for outpatient dialysis with Tuesday, Thursday and Saturday schedule and will [facility] as [facility].  The patient's encephalopathy resolved with dialysis.  He felt stronger and was discharged home in fair condition.  2.  Insulin-dependent diabetes mellitus with hypoglycemia.  The patient had hypoglycemic episodes as low as 36 blood glucose during the hospital stay after being continued on home insulin dose.  His episodes of encephalopathy at home could also have been likely from the hypoglycemic episodes.  The patient's insulin has been  decreased from 20 units at home to 8 units and his blood sugars are stable at the time of discharge with the last blood sugar reading of 151.  3.  Hypertension.  The patient had significantly elevated blood pressure into the 200s, which improved after being started on dialysis also.  The patient has been started on losartan by Dr. Cherylann RatelLateef which will be continued as outpatient.  4.  The patient's CVA, coronary artery disease and BPH were stable during the hospital stay.   On the day of discharge, the patient is alert and oriented x 3 on exam.  His temperature is 98, blood pressure 122/50, saturating 96% on room air.   DISCHARGE MEDICATIONS:  1.  Norvasc 10 mg oral once a day.  2.  Crestor 10 mg oral once a day.  3.  Vitamin D3 1000 international units oral once a day.  4.  Fish oil 1000 mg oral once a day.  5.  Doxazosin 8 mg oral once a day.  6.  Hydralazine 100 mg oral 3 times a day.  7.  Metoprolol tartrate 25 mg oral 2 times a day.  8.  Sodium bicarbonate 650 mg oral 2 times a day.  9.  Clopidogrel 75 mg oral once a day.  10.  Acetaminophen/hydrocodone 325/5 one tablet oral every 6 hours as needed for pain.  11.  Losartan 50 mg oral once a day.  12.  Lantus 6 units subcutaneous once a day.   DISCHARGE INSTRUCTIONS:  The patient will be on a renal diabetic diet.  Activity as tolerated.  Continue with outpatient dialysis which has been  set up for Tuesday, Thursday and Saturday schedule.  On the day of discharge, I have spoken with the patient's daughter over the phone and asked her to check the blood sugars 4 times a day prior to meals and at bedtime, keep a log and take it to his doctor's appointment.   TIME SPENT:  On day of discharge on discharge activity was 35 minutes.   ____________________________ Molinda Bailiff Marinell Igarashi, MD srs:ja D: 08/07/2012 14:07:26 ET T: 08/07/2012 19:56:53 ET JOB#: [id number]  cc: Wardell Heath R. Vishwa Dais, MD, <Dictator> Burnett Harry. Stacie Acres, PA Duanne Guess,  MD Orie Fisherman MD ELECTRONICALLY SIGNED 08/08/2012 14:57

## 2014-08-24 NOTE — Op Note (Signed)
PATIENT NAME:  Benjamin Freeman, Benjamin Freeman MR#:  [id number] DATE OF BIRTH:  1931/05/30  DATE OF PROCEDURE:  07/13/2012  PREOPERATIVE DIAGNOSES: 1.  Chronic kidney disease nearing dialysis dependence.  2.  History of stroke.  3.  Diabetes.  4.  Hypertension.   POSTOPERATIVE DIAGNOSES: 1.  Chronic kidney disease nearing dialysis dependence.  2.  History of stroke.  3.  Diabetes.  4.  Hypertension.   PROCEDURE PERFORMED:  Left brachiocephalic AV fistula creation.   SURGEON: Annice NeedyJason S. Dew, M.D.   ANESTHESIA: General.   ESTIMATED BLOOD LOSS: Approximately 25 mL.   INDICATION FOR PROCEDURE: The patient is a 81 year old African American male with chronic kidney disease. This is in the stage V range and is nearing dialysis dependence and a fistula is needed for permanent dialysis access. Risks and benefits were discussed and informed consent was obtained.   DESCRIPTION OF PROCEDURE: The patient was brought to the operative suite and, after an adequate level of general anesthesia was obtained, the left upper extremity was sterilely prepped and draped and a sterile surgical field was created.  He had a cephalic vein that was easily visualized at the antecubital fossa. A curvilinear incision between the cephalic vein and the palpable brachial artery was created. The cephalic vein was dissected out and marked for orientation. There was a bifurcation just below the antecubital fossa and would be at this level that we would create the anastomosis. It was ligated distally and divided and a bulbar clamp was used for control. The artery was then encircled with vessel loops proximally and distally. The patient was given 3000 units of intravenous heparin for systemic anticoagulation and control was pulled up on the vessel loops. An anterior wall arteriotomy was created with an 11 blade and extended with Potts scissors. I then opened the venotomy to match the arteriotomy and created an anastomosis with a running 6-0 Prolene  suture in the usual fashion. The vessel was flushed and de-aired prior to release of control. On release there was a nice thrill within the AV fistula and a palpable pulse in the artery distal to the fistula. At this point, I elected to terminate the procedure. Surgicel was placed. The wound was closed with running 3-0 Vicryl and 4-0 Monocryl. Dermabond was placed as a dressing. The patient tolerated the procedure well and was taken to the recovery room in stable condition. ____________________________ Annice NeedyJason S. Dew, MD jsd:sb D: 07/13/2012 13:50:58 ET T: 07/13/2012 14:03:59 ET JOB#: [id number]  cc: Annice NeedyJason S. Dew, MD, <Dictator> Annice NeedyJASON S DEW MD ELECTRONICALLY SIGNED 07/18/2012 11:50

## 2014-08-24 NOTE — H&P (Signed)
PATIENT NAME:  Benjamin Freeman, Benjamin Freeman MR#:  [id number] DATE OF BIRTH:  03-30-1932  DATE OF ADMISSION:  10/30/2012  EMERGENCY DEPARTMENT REFERRING PHYSICIAN:  Dr. Enedina FinnerGoli.   PRIMARY CARE PHYSICIAN:  Dr. Ellsworth Lennoxejan-Sie.   CHIEF COMPLAINT:  Lethargy and altered mental status brought in by wife.   HISTORY OF PRESENT ILLNESS:  This is a 81 year old African American male with past medical history of recently diagnosed complex partial seizures, end-stage renal disease on hemodialysis on Monday, Wednesday, Friday, hypertension, diabetes, previous history of CVA and end-stage renal disease with recent removal of right jugular PermCath that is presenting with the above chief complaint. History is per the wife. Wife states that the patient got dialysis yesterday and shortly afterwards was noted to have periods of lethargy and confusion. As the night went on, he was becoming more and more confused. She decided that she would let him rest and see how he was doing this morning. This morning, he could not recall his name or his date of birth at which point she called EMS. Upon EMS arrival, he was noted to be lethargic, could not stand on his own which he is normally able to do and he could not recall his name and date of birth at that time. He was brought to the Emergency Room for further workup and management. The wife states that he may have been having a fever over the last 1 to 2 days, but she did not check a temperature. Of note, he was recently in the hospital for a similar presentation about a month ago and was diagnosed with complex partial seizures and seen by the on-call neurologist. He was advised to have an outpatient MRI and he was started on Keppra at that time. Per the records, the patient did have an MRI done that showed no acute ischemic changes and no new acute findings. It is unsure at this time if the patient did have an outpatient followup. In the ED upon my arrival, the patient was noted to be mildly confused, but  he was able to state his name and date of birth. He did show some weakness and he did have a mild elevation of temperature of 99.8. Hospitalist services were consulted for further inpatient workup and management. Of note also, the patient has a recent diagnosis of COPD and seen by Dr. Meredeth IdeFleming; otherwise, he denies any shortness of breath, wheezing or nausea.   PAST MEDICAL HISTORY:  Diabetes, hypertension, previous CVA, recent diagnosis of complex partial seizures, end-stage renal disease and COPD.   ALLERGIES:  None.   HOME MEDICATIONS:  Anoro Ellipta 62.5/25 mcg inhalation powder 1 puff daily once a day, Plavix 75 mg daily, Crestor 10 mg a day, doxazosin 8 mg 1 tab with morning with morning meal, fish oil 1000 mg 1 tab daily, hydralazine 100 mg 1 tab t.i.d., Lantus 6 units subcutaneous at bedtime as needed for increasing blood sugar, Keppra 250 mg 1 tab Monday, Wednesday, Friday after hemodialysis, Keppra 500 mg 1 tab daily, losartan 50 mg 1 tab daily, metoprolol tartrate 25 mg 1 tab b.i.d., multivitamin 1 tab daily, sertraline 50 mg 1 tab daily, albuterol 2 puffs q. 4 hours as needed for shortness of breath and wheezing, vitamin D2 at 50,000 International Units 1 cap once a week and vitamin D3 at 1000 International Units 1 capsule once a day in the morning.   SOCIAL HISTORY:  Used to be a smoker, quit over 20 years ago. Use to drink about 20 years  ago also. No IV drug use. Lives at home with his wife. Recently driving privileges have been put on hold secondary to his confusion and altered mental status from his past admission and diagnosis complex partial seizures.   FAMILY HISTORY:  Both mother and father had high blood pressure, but no history of chronic kidney disease.   REVIEW OF SYSTEMS:  Difficult to obtain since patient is partially altered, but he was able to state the following:  CONSTITUTIONAL: Mild fever and weakness. Otherwise, no pain, weight loss, weight gain.  EYES: No blurred vision,  double vision, pain or redness.  ENT: No tinnitus, ear pain, hearing loss, seasonal allergies or year-round allergies. RESPIRATORY: Positive cough nonproductive, but no wheeze, hemoptysis, dyspnea, asthma, painful respiration. Positive for COPD, no TB or pneumonia.  CARDIOVASCULAR: No chest pain, orthopnea, edema, arrhythmia, dyspnea on exertion. No palpitations or syncope. Positive high blood pressure. No varicose veins. GASTROINTESTINAL: No nausea, vomiting, diarrhea, abdominal pain, hematemesis, melena, GERD.  GENITOURINARY: No dysuria, hematuria. States that he still makes urine.  ENDOCRINE: No polyuria, nocturia or thyroid problems.  HEMATOLOGIC AND LYMPHATICS: No anemia, easy bruising, bleeding or swollen glands.  INTEGUMENTARY: No acne, rash, change in mole, hair or skin.  MUSCULOSKELETAL: No pain in neck, back, shoulders, knees or hips. No arthritis.  NEUROLOGIC: No numbness. Positive weakness. No dysarthria, epilepsy, tremor, vertigo, dementia, headache, migraine. History of CVA and history of seizure.  PSYCHIATRIC: No anxiety, ADD, OCD, bipolar, depression.   PHYSICAL EXAMINATION:  VITAL SIGNS: In the ED are as follows: Temperature 99.1, blood pressure is 120/56, respirations 18, pulse 83.  GENERAL APPEARANCE: Well-nourished, well-developed male lying in bed in no acute respiratory distress.  HEENT: PERRLA. EOMI. No scleral icterus, no conjunctivitis or difficulty hearing. TMs are intact. No pharyngeal erythema. Mucous membranes are mildly dry.  NECK: No thyroid enlargement or nodules. Neck is supple and nontender.  RESPIRATORY: Clear to auscultation bilaterally. There are some upper airway coarse sounds. Otherwise, no wheezing or diminished breath sounds. No use of intercostal muscles.  CARDIOVASCULAR: Regular rate, regular rhythm. S1, S2 is auscultated, no S3 no S4. Good pedal pulses. Trace lower extremity edema is noted.  ABDOMEN: Soft, nontender, nondistended. Positive bowel sounds.   MUSCULOSKELETAL: 5 out of 5 strength bilateral upper and lower extremities. Gait is unsteady.  SKIN: No rash. No lesions. No erythema. He has a fistula or graft noted in his right upper extremity. The site is nontender. It is not weeping. It does not look erythematous.  LYMPHATIC: No adenopathy noted in cervical, axilla or supraclavicular regions.  NEUROLOGIC: Cranial nerves II through XII intact. Deep tendon reflexes intact. Sensory is intact.  PSYCHIATRIC: Alert to self, oriented to name and place, but disoriented to time, cooperative, poor judgment noted. Memory is impaired at this time and he does possess mild confusion.   LABORATORY AND DIAGNOSTIC DATA:  He had a BMP that shows the following: Sodium 140, potassium 4.0, chloride 104, bicarbonate 26, BUN 71, creatinine 10.11, glucose is at 118. BNP is at 2168. His troponin is less than 0.02. He also had a CBC done that showed the following: White cell count was 8.5, hemoglobin 12.4, hematocrit 36.4, platelet count 203. He had a chest x-ray done that showed the following: Interstitial markings are mildly increased, although there is not entirely new. This may reflect low-grade CHF. There is no focal pneumonia. A followup PA and lateral chest x-ray would be of value. EKG shows normal sinus rhythm, no acute ST-T wave changes, rate  of about 90.   ASSESSMENT AND PLAN:  The patient is a 81 year old African American male with end-stage renal disease, hypertension, diabetes and history of previous cerebrovascular accident with recently diagnosed complex partial seizures who presented with lethargy and altered mental status over the past 24 hours.  1.  Acute onset of confusion and weakness. This could be a possible transient ischemic attack versus another episode of complex partial seizures. He had a similar episode about 1 month ago which he was recently diagnosed with complex partial seizures. He did present after that admission and did have a similar episode  with confusion and weakness in the Emergency Department and it spontaneous resolved. He was discharged at that time. He had an outpatient MRI done with no acute findings. His wife states she is unsure if he has seen a neurologist as an outpatient. In the acute setting, I would do neuro checks and get another head CT to evaluate for any further ischemic changes or worsening of his previous areas of insult. We will continue with his home antiepileptic drugs. The patient is actually more alert now and more able to state name, location and date of birth while I was in the room evaluating him. Continue with neuro checks and supportive care.  2.  Questionable pneumonia with increased vascular markings. Chest x-ray did show increased vascular markings. No focal infiltrate. He does have a mild fever and possibly he has fluid overload. His BNP is elevated. He does have coarse upper airway sounds and a nonproductive cough. He did get a dose of vancomycin and Zosyn in the Emergency Department; however, he does not have any white blood cell count and no documented fever. We will follow up on his urine and blood cultures prior to starting antibiotics at this time. We will give him a dose of Lasix 20 mg intravenously x 1 since the wife states he is able to make urine.  3.  End-stage renal disease on hemodialysis on Monday, Wednesday, Friday. Given his increase in vascular congestion and his current BUN and creatinine, we will consult nephrology.  4.  Hypertension. Continue with home medications of Norvasc, hydralazine and metoprolol.  5.  Diabetes. Continue with insulin sliding scale and Lantus as needed.  6.  Gastrointestinal and deep vein thrombosis prophylaxis. Place him on proton pump inhibitor and sequential compression devices since the patient is on aspirin and Plavix together.   The patient is a full code.   ____________________________ Stephanie Acre, MD vm:si D: 10/30/2012 16:34:00 ET T: 10/30/2012 17:55:36  ET JOB#: [id number]  cc: Stephanie Acre, MD, <Dictator> Stephanie Acre MD ELECTRONICALLY SIGNED 10/31/2012 10:52

## 2014-08-25 LAB — BASIC METABOLIC PANEL
ANION GAP: 14 (ref 7–16)
BUN: 34 mg/dL — AB
CO2: 31 mmol/L
Calcium, Total: 8 mg/dL — ABNORMAL LOW
Chloride: 93 mmol/L — ABNORMAL LOW
Creatinine: 6.24 mg/dL — ABNORMAL HIGH
EGFR (African American): 9 — ABNORMAL LOW
EGFR (Non-African Amer.): 8 — ABNORMAL LOW
Glucose: 187 mg/dL — ABNORMAL HIGH
POTASSIUM: 3.9 mmol/L
Sodium: 138 mmol/L

## 2014-08-25 LAB — CBC WITH DIFFERENTIAL/PLATELET
BASOS PCT: 0.1 %
Basophil #: 0 10*3/uL (ref 0.0–0.1)
EOS ABS: 0 10*3/uL (ref 0.0–0.7)
Eosinophil %: 0 %
HCT: 32.9 % — ABNORMAL LOW (ref 40.0–52.0)
HGB: 10.6 g/dL — ABNORMAL LOW (ref 13.0–18.0)
Lymphocyte #: 0.5 10*3/uL — ABNORMAL LOW (ref 1.0–3.6)
Lymphocyte %: 8.5 %
MCH: 26.6 pg (ref 26.0–34.0)
MCHC: 32.3 g/dL (ref 32.0–36.0)
MCV: 82 fL (ref 80–100)
Monocyte #: 0.5 x10 3/mm (ref 0.2–1.0)
Monocyte %: 8.6 %
NEUTROS ABS: 4.5 10*3/uL (ref 1.4–6.5)
Neutrophil %: 82.8 %
Platelet: 188 10*3/uL (ref 150–440)
RBC: 4 10*6/uL — ABNORMAL LOW (ref 4.40–5.90)
RDW: 16.5 % — ABNORMAL HIGH (ref 11.5–14.5)
WBC: 5.5 10*3/uL (ref 3.8–10.6)

## 2014-08-25 NOTE — H&P (Signed)
PATIENT NAME:  Benjamin Freeman, Benjamin Freeman MR#:  [id number] DATE OF BIRTH:  05/25/1931  DATE OF ADMISSION:  06/13/2013  PRIMARY CARE PHYSICIAN:  Dr. Ellsworth Lennox  REFERRING PHYSICIAN: Dr. Scotty Court  CHIEF COMPLAINT:  Fever and tachycardia, associated with urinary incontinence.   HISTORY OF PRESENT ILLNESS: The patient is a 81 year old African American male with history of end-stage renal disease on hemodialysis, diabetes mellitus, hypertension, dementia, stroke and COPD, questionable seizures on Keppra, is presenting to the ER with a chief complaint of fever. The patient was sent over to the ED by EMS with fever and weakness. The patient had dialysis yesterday and the family stated that after dialysis the patient was feeling weaker then his baseline. Temperature was 100.9 and heart rate was 113. The patient denied any chest pain, nausea, vomiting, or shortness of breath. As he has chronic dementia, he is a poor historian and he could not recall why he was sent over to the ER during my examination. No family members are at bedside.   PAST MEDICAL HISTORY: COPD, questionable seizures on Keppra, history of stroke, hypertension, diabetes mellitus, end-stage renal disease on hemodialysis.   PAST SURGICAL HISTORY: Left upper extremity dialysis catheter is present.   ALLERGIES:  No known drug allergies.   PSYCHOSOCIAL HISTORY: Lives at home with family. Quit smoking approximately 20 years ago. No history of alcohol or illicit drug use according to the old records.   FAMILY HISTORY: Hypertension runs in his family.   HOME MEDICATIONS:  Zoloft 50 mg p.o. once daily, vitamin D3 1000 international units 1 capsule p.o. once daily, metoprolol 20 mg twice a day, Lantus 6 units subcutaneous once daily, Keppra 500 mg 1 tablet p.o. once daily, hydralazine 100 mg p.o. 3 times a day, doxazosin 8 mg 1 tablet p.o. once daily, Crestor 10 mg p.o. once daily, Combivent 1 puff inhalation 4 times a day, amlodipine 10 mg once  daily.  REVIEW OF SYSTEMS: Unobtainable as the patient is demented.   PHYSICAL EXAMINATION: VITAL SIGNS: Temperature 98.6, pulse 76, respirations 18, blood pressure 146/79. Pulse oxygen 96%.  GENERAL APPEARANCE: Not in acute distress. Moderately built and nourished.  HEENT: Normocephalic, atraumatic. Pupils are equally reacting to light and accommodation. Moist mucous membranes. Ear canals and nares are patent. No icterus.  NECK: Supple. No JVD. No thyromegaly. Trachea is midline. No carotid bruits.  LUNGS: Clear to auscultation. No crackles, no rhonchi.  CARDIOVASCULAR: S1, S2 normal. Regular rate and rhythm. No murmurs.  GASTROINTESTINAL: Soft. Bowel sounds are positive in all 4 quadrants. Nontender, nondistended. No hepatosplenomegaly. No masses.  NEUROLOGIC: Awake and alert, but the patient is demented and a poor historian. Motor and sensory are grossly intact. Reflexes are 2+.  EXTREMITIES: Left upper extremities with fistula.  SKIN: Warm to touch. Normal turgor. No rashes. No lesions.  MUSCULOSKELETAL: No joint effusion, tenderness, erythema.    LABORATORY AND IMAGING STUDIES: LFTs are normal except albumin at 3.1. Troponin less than 0.02. WBC 13.1, hemoglobin 9.3, hematocrit 28.5, platelets 233. Lactic acid 1.1. Blood culture:  No growth so far. Accu-Chek 287, glucose 229, BUN 21, creatinine 5.89, sodium 134, potassium 3.3, chloride 96, CO2 36, GFR 10, anion gap is 2. Serum osmolality 278, calcium 9.0. CT of the head without contrast has revealed no acute intracranial process. Stable atrophy and chronic microvascular ischemic disease. Remote left MCA territory infarct and right cerebellar infarct unchanged. Chest x-ray PA and lateral: No acute disease.   ASSESSMENT AND PLAN: A 81 year old [place] American male sent over  to the ER for fever and generalized weakness, will be admitted with following assessment and plan.  1.  Systemic inflammatory response syndrome.  Source of infection is  unclear at this time, probably acute cystitis. Blood cultures and urine cultures were obtained.  The patient will be on IV Rocephin  empirically.  2.  Altered mental status, on chronic underlying dementia, probably from acute cystitis and complaint of metabolic encephalopathy.  We will closely monitor the patient and treat with IV antibiotics.  3.  End-stage renal disease on hemodialysis. We will get a Nephrology consult.  4.  Chronic obstructive pulmonary disease. Not in exacerbation. We will provide him with treatment on as needed basis.  5.  Hypertension. Resume his home medication.  6.  Diabetes mellitus. We will continue his home medication Lantus and the patient will be on sliding scale insulin.  7.  We will provide gastrointestinal and deep vein thrombosis prophylaxis.   Transfer the patient to Dr. Ellsworth Lennoxejan-Sie in a.m.   Total time spent on admission is 45 minutes.   ____________________________ Benjamin LabAruna Rayley Gao, MD ag:dp D: 06/13/2013 07:23:55 ET T: 06/13/2013 07:48:00 ET JOB#: [id number]  cc: Benjamin LabAruna Dicie Edelen, MD, <Dictator> Benjamin LabARUNA Nekita Pita MD ELECTRONICALLY SIGNED 06/25/2013 3:22

## 2014-08-25 NOTE — Discharge Summary (Signed)
PATIENT NAME:  Benjamin Freeman, Shashank F MR#:  [id number] DATE OF BIRTH:  11-06-1931  DATE OF ADMISSION:  12/04/2013 DATE OF DISCHARGE:  12/07/2013  ADMITTING PHYSICIAN:  Patricia PesaVipul S Shah, MD    DISCHARGE PHYSICIAN:  Charlesetta GaribaldiSheikh A Tejan-Sie, MD   ADMITTING DIAGNOSES:  Unstable angina, pneumonitis or early pneumonia, end-stage renal disease and diabetes.   DISCHARGE DIAGNOSES:  Pneumonia, chest pain, end-stage renal disease.   PROCEDURES:  Hemodialysis.   CONSULTATIONS: Nephrology, Dr. Cherylann RatelLateef; cardiology, Dr. Adrian BlackwaterShaukat Khan   IMAGING:  Chest x-ray on 12/04/2013, with suggestion of interstitial edema versus mild pneumonitis.   HISTORY OF PRESENT ILLNESS: This gentleman was admitted through the Emergency Room presenting with chest pain, suggesting unstable angina, and radiological suggestive of pneumonitis, please refer to the history and physical for details.   HOSPITAL COURSE: The patient was admitted to a monitored bed. His cardiac enzymes were negative x 3 at 0.02. As far as EKG, it did not show any acute ST or T wave abnormalities. The patient was treated with antibiotics for community-acquired pneumonia without any complications. He underwent hemodialysis without complications also, and was discharge to home on a course of oral antibiotics with instructions to followup with cardiology as an outpatient.    A (Dictation Anomaly) <<MISSING TEXT>>:  1.  Believe the patient acquired inpatient stress testing, or luminal investigation.  2.  The patient discharged to home in satisfactory condition.   INSTRUCTIONS:  DIET: Renal diet, cardiac diet.   ACTIVITY: As tolerated.   FOLLOWUP: With cardiology as scheduled, with nephrology as scheduled, and to resume hemodialysis.   DISCHARGE MEDICATIONS: Please refer to medical reconciliation that was completed, and reviewed by me.   DISCHARGE PROCESS TIME SPENT: Was 35 minutes.    ____________________________ Silas FloodSheikh A. Ellsworth Lennoxejan-Sie, MD sat:nt D: 12/26/2013  14:06:08 ET T: 12/26/2013 21:46:14 ET JOB#: [id number]  cc: Marland McalpineSheikh A. Ellsworth Lennoxejan-Sie, MD, <Dictator>

## 2014-08-25 NOTE — Discharge Summary (Signed)
PATIENT NAME:  Benjamin Freeman, [name] MR#:  [id number] DATE OF BIRTH:  1932/05/02  DATE OF ADMISSION:  06/13/2013 DATE OF DISCHARGE:  06/15/2013  ADMITTING PHYSICIAN: Ramonita LabAruna Gouru, MD  DISCHARGING PHYSICIAN: Bluford MainSheikh Tejan-Sie, MD  PROCEDURES: Hemodialysis.   CONSULTATIONS: Nephrology.   DISCHARGE DIAGNOSES: 1.  Encephalopathy, presumed urinary tract infection.  2.  End-stage renal disease on hemodialysis Monday, Wednesday, and Friday.  3.  History of seizure disorder, diabetes mellitus, and hypertension.   IMAGING: CT scan of the head negative for acute intracranial abnormality. No old infarcts noted.  Chest x-ray: No acute infiltrates.   HOSPITAL COURSE: This elderly gentleman well known to me was admitted through the Emergency Room presenting with altered mental status per his wife. She states he has been compliant with all his other medications. Please refer to the history and physical for details. The patient was presumed to have a urinary tract infection, started on empiric antibiotics. The patient'Berry Godsey mental status started improving within 12 hours of admission, following a course of antibiotics. Within 48 hours the patient'Kathie Posa mental status had returned to his baseline. The patient'Yaroslav Gombos urinalysis was not able to be performed as the patient is anuric. His blood cultures were negative for any growth. The patient underwent hemodialysis without any complications, supervised by nephrology. His hospital stay was uncomplicated. He continued his medications, his Keppra. His white cell count, which was elevated on presentation, continued to decline and normalized by the time of discharge. Given the improvement in his clinical status, especially mental state and normalization of his white cell count, it was presumed that he suffered from a urinary tract infection. He was discharged to home with home health on oral antibiotics.   DISCHARGE CONDITION: Satisfactory.   DIET: Low sodium, ADA.  DISCHARGE  ACTIVITY: As tolerated.   DISCHARGE FOLLOWUP: In 1 to 2 weeks with Dr. Ellsworth Lennoxejan-Sie.    DISCHARGE MEDICATIONS: Please refer to medication reconciliation. Discharge antibiotic was amoxicillin clavulanate acid 500/125 mg daily.   DISCHARGE PROCESS TIME SPENT: 33 minutes.  ____________________________ Silas FloodSheikh A. Ellsworth Lennoxejan-Sie, MD sat:sb D: 07/03/2013 14:11:20 ET T: 07/04/2013 08:51:48 ET JOB#: [id number]  cc: Sheikh A. Ellsworth Lennoxejan-Sie, MD, <Dictator> Charlesetta GaribaldiSHEIKH A TEJAN-SIE MD ELECTRONICALLY SIGNED 07/08/2013 11:24

## 2014-08-25 NOTE — Discharge Summary (Signed)
PATIENT NAME:  Benjamin Freeman, [name] MR#:  [id number] DATE OF BIRTH:  11-Aug-1931  DATE OF ADMISSION:  04/23/2013 DATE OF DISCHARGE:  04/26/2013  ADMITTING PHYSICIAN:  Dr. Randol KernElgergawy.   DISCHARGE PHYSICIAN:  Dr. Ellsworth Lennoxejan-Sie.   DISCHARGE DIAGNOSES:  1.  Fever of unknown origin.  2.  Altered mental status.  3.  End-stage renal disease.  Secondary DIAGNOSES: 1.  Diabetes mellitus.  2.  Hypertension.   PROCEDURES:  Hemodialysis.   CONSULTATIONS:  Nephrology, Dr. Mady HaagensenMunsoor Lateef.   HOSPITAL COURSE:  This patient was admitted on 12/21 after presenting to the Emergency Room with lethargy, weakness and altered mental status. He was also found to be quite febrile with leukocytosis. However. radiological studies did not reveal any focus of infection. His influenza swab was negative. The patient was anuric. He was placed on empiric antibiotics with a normalization of his white cell count and defervescence. His fistula was not inflamed neither did it exhibit any discharge to suggest that being the source of infection. The patient'Shirlee Whitmire mental status also improved dramatically within 48 hours. Blood cultures were drawn and were  negative for growth except strep viridans in 1 out of 2 bottles. Repeat cultures were negative at the time of his discharge. The patient now continues scheduled hemodialysis under the supervision of Nephrology without any complications. He did not exhibit any seizures while he was here, neither.   The patient is discharged to home in satisfactory condition.     DISCHARGE INSTRUCTIONS: DIET:  Renal diet, low fat, low calories.   ACTIVITY:  As tolerated.   FOLLOW UP:  He is to keep his followup appointment with Dr. Ellsworth Lennoxejan-Sie.   We will continue to follow his culture results and adjust antibiotics accordingly.   DISCHARGE MEDICATIONS:  The patient is to resume his home medications. I have added Augmentin 500 mg once a day x 10 days.   ____________________________ Silas FloodSheikh A. Ellsworth Lennoxejan-Sie,  MD sat:jm D: 04/27/2013 13:21:05 ET T: 04/27/2013 17:18:42 ET JOB#: [id number]  cc: Sheikh A. Ellsworth Lennoxejan-Sie, MD, <Dictator> Charlesetta GaribaldiSHEIKH A TEJAN-SIE MD ELECTRONICALLY SIGNED 05/08/2013 13:52

## 2014-08-25 NOTE — H&P (Signed)
PATIENT NAME:  Benjamin Freeman, Benjamin Freeman MR#:  [id number] DATE OF BIRTH:  March 17, 1932  DATE OF ADMISSION:  07/31/2013  REFERRING PHYSICIAN: Dr. Darnelle Catalan.   PRIMARY CARE PHYSICIAN: Dr. Ellsworth Lennox.    CHIEF COMPLAINT: Altered mental status.   HISTORY OF PRESENT ILLNESS: A 81 year old African American gentleman with history of dementia, end-stage renal disease on hemodialysis, presenting with altered mental status from his dialysis. The patient is unable to provide any relevant information given current mental status; however, apparently while at dialysis, he had decreased mental status and noted to have a low blood pressure; thus, sent to the Emergency Department. On arrival, he was noted to be hypoxemic, on room air saturating in the 80s. Once again, he is unable to provide any further information given mental status. Per his wife at bedside, states that this is actually close to his baseline mental status. He has also been describing a cough for a few days' duration, nonproductive. No fevers or chills. No further symptomatology. On initial ER course, he was hypoxemic, given supplemental O2. Initial labs were performed.   REVIEW OF SYSTEMS: Unable to obtain secondary to the patient's mental status.   PAST MEDICAL HISTORY: Dementia, diabetes, end-stage renal disease on dialysis, hypertension, COPD, seizure disorder.   SOCIAL HISTORY: Remote tobacco abuse. No alcohol or drug usage.   FAMILY HISTORY: Positive for hypertension.   ALLERGIES: No known drug allergies.   HOME MEDICATIONS: Prednisone 20 mg daily, Cardura 10 extended release 8 mg daily, Keppra 250 mg on dialysis days Monday, Wednesday, Friday and 500 mg daily, sertraline 50 mg p.o. daily, Lantus 60 units at nighttime, Crestor 10 mg p.o. daily, metoprolol 50 mg p.o. b.i.d., Combivent 100/20 mcg inhalation 1 puff 4 times a day as needed for shortness of breath, Norvasc 10 mg p.o. daily, fish oil 1000 mg p.o. b.i.d., Fosrenol 1000 mg p.o. 3 times daily,  hydralazine 100 mg p.o. b.i.d., multivitamin 1 tablet daily, vitamin D3 1000 international units p.o. daily.   PHYSICAL EXAMINATION:  VITAL SIGNS: Temperature 97.6, heart rate 79, respirations 22, blood pressure 172/89, saturating 82% on room air, currently saturating 97% on room air. Weight 90.7 kg, BMI 27.2.  GENERAL: Frail-appearing African American gentleman in minimal distress given tachypnea.  HEAD: Normocephalic, atraumatic.  EYES: Pupils equal, round and reactive to light. Extraocular muscles intact. No scleral icterus.  MOUTH: Dry mucosal membranes. Dentition intact. No abscess noted.  EARS, NOSE, THROAT: Throat is clear without exudates. No external lesions.  NECK: Supple. No thyromegaly. No nodules. No JVD.  PULMONARY: Markedly decreased breath sounds throughout all lung fields without frank wheezing, rales or rhonchi. No use of accessory muscles but tachypneic. Poor respiratory effort.  CHEST: Nontender to palpation.  CARDIOVASCULAR: S1, S2, regular rate and rhythm. No murmurs, rubs or gallops. No edema. Pedal pulses 2+ bilaterally.  GASTROINTESTINAL: Soft, nontender, nondistended. No masses. Positive bowel sounds. No hepatosplenomegaly.  MUSCULOSKELETAL: No swelling, clubbing, edema. Range of motion full in all extremities.  NEUROLOGIC: Cranial nerves II through XII intact. No gross focal neurological deficits. Sensation intact. Reflexes intact.  SKIN: No ulcerations, lesions, rash, cyanosis. Skin warm, dry. Turgor intact.  PSYCHIATRIC: Mood and affect blunted and flat. He is awake and oriented to person only. Insight and judgment poor.   LABORATORY DATA: EKG performed; normal sinus rhythm, heart rate 75. No ST or T wave abnormalities. Chest x-ray performed, reveals mildly increased interstitial markings, though appears stable. CT chest performed revealing moderate to severe mixed emphysematous and interstitial fibrotic changes throughout right  lung fields as well as reactive  mediastinal lymph nodes. Remainder of laboratory data: Sodium 136, potassium 3.4, chloride 95, bicarb 32, BUN 28, creatinine 5.8, glucose 113. BNP 2441. LFTs within normal limits. WBC 7.2, hemoglobin 11.2, platelets of 224. ABG performed, 7.71, CO2 of 24, O2 of 67, bicarb of 30.4, with lactic acid of 1.5.   ASSESSMENT AND PLAN: A 81 year old gentleman with history of dementia, end-stage renal cyst on dialysis, presenting with altered mental status.  1. Encephalopathy: According to wife, he is actually surprisingly not far from baseline. Unclear of what change may have occurred at dialysis, though sounds like he is close to where he belongs.  2. Metabolic and respiratory alkalosis: Appears to be chronic obstructive pulmonary disease as well as fibrosis on CT. Also, he appears to be on chronic steroids per documentation. Will continue steroids for chronic obstructive pulmonary disease as well as fibrosis. He is likely immunosuppressed given reactive lymphadenopathy. Will panculture, including blood and sputum. Antibiotic coverage with vancomycin, Zosyn and Levaquin, downgrading antibiotics when culture data is returned, treating for presumptive pulmonary source of infection.  3. End-stage renal disease on dialysis: Consult nephrology for continuation of hemodialysis.  4. Hypertension: Continue metoprolol and Norvasc.  5. Venous thromboembolism with heparin subcutaneous.   The patient is FULL CODE.   TIME SPENT: 55 minutes.   ____________________________ Cletis Athensavid K. Hower, MD dkh:gb D: 07/31/2013 22:36:45 ET T: 07/31/2013 23:00:22 ET JOB#: [id number]  cc: Cletis Athensavid K. Hower, MD, <Dictator> DAVID Synetta ShadowK HOWER MD ELECTRONICALLY SIGNED 07/31/2013 23:57

## 2014-08-25 NOTE — Op Note (Signed)
PATIENT NAME:  Benjamin Freeman, Benjamin Freeman MR#:  [id number] DATE OF BIRTH:  [birth year]  DATE OF PROCEDURE:  07/18/2013  PREOPERATIVE DIAGNOSES: 1.  Complication of arteriovenous fistula.  2.  End-stage renal disease, requiring hemodialysis.  3.  Hypertension.  4.  Dementia.    POSTOPERATIVE DIAGNOSES: 1.  Complication of arteriovenous fistula.  2.  End-stage renal disease, requiring hemodialysis.  3.  Hypertension.  4.  Dementia.   PROCEDURES PERFORMED: 1.  Contrast injection, left brachiocephalic fistula.  2.  Percutaneous transluminal angioplasty to 10 mm of the left brachiocephalic fistula within the venous cannulation zone.   SURGEON: Renford DillsGregory G. Sarkis Rhines, M.D.   SEDATION: Versed 3 mg plus fentanyl 100 mcg administered IV. Continuous ECG, pulse oximetry and cardiopulmonary monitoring was performed throughout the entire procedure by the interventional radiology nurse. Total sedation time was 45 minutes.   ACCESS: A 6-French sheath, antegrade direction, left arm AV fistula.   FLUOROSCOPY TIME: 1.2 minutes.   CONTRAST USED: Isovue 30 mL.   INDICATIONS: Mr. Benjamin Freeman is a 81 year old gentleman who was sent from dialysis for increasing difficulty with his fistula. Physical examination as well as noninvasive studies demonstrated a stricture within the venous cannulation site. Risks and benefits were reviewed. All questions have been answered. The patient has agreed to proceed with angiography and possible intervention.   DESCRIPTION OF PROCEDURE: The patient is taken to special procedures and placed in the supine position. After adequate sedation is achieved, the patient is positioned supine with his left arm extended palm upward. The left arm is prepped and draped in a sterile fashion. Lidocaine 1% is infiltrated in the soft tissues and access to the fistula is obtained with a micropuncture needle in an antegrade direction, microwire followed by microsheath, J-wire followed by a 6-French sheath. Hand  injection of contrast is then utilized to demonstrate the fistula as well as the central venous anatomy.   After review of the images, 3000 units of heparin is given. A Magic torque wire is advanced across the lesion and initially a 6 x 4 Lutonix balloon is inflated to 12 atmospheres for 2 minutes. Following this, an 8 x 4 balloon and subsequently a 10 x 2 balloon are inflated, the 10 x 2 inflation is for 1-1/2 minutes to 16 atmospheres. Followup angiography now demonstrates complete resolution of the lesion and by physical examination there is a continuous soft thrill noted in the fistula. This is a marked improvement over the pulsatility noted prior to the angioplasty.   The pursestring suture of 4-0 Monocryl is placed around the sheath. The sheath is removed, light pressure is held and there are no immediate complications.   INTERPRETATION: Initial views of the images of the fistula demonstrate a narrowing in the area of the venous cannulation. The cephalic subclavian confluence is patent and the innominate and superior vena cava are patent as well. Following angioplasty to 10 mm, there is resolution of this lesion.   SUMMARY: Successful salvage of left brachiocephalic fistula as described above.   ____________________________ Renford DillsGregory G. Fard Borunda, MD ggs:cs D: 07/18/2013 16:44:00 ET T: 07/18/2013 20:39:49 ET JOB#: [id number]  cc: Renford DillsGregory G. Janziel Hockett, MD, <Dictator> Mosetta PigeonHarmeet Singh, MD Marland McalpineSheikh A. Ellsworth Lennoxejan-Sie, MD Renford DillsGREGORY G Maya Scholer MD ELECTRONICALLY SIGNED 07/25/2013 18:49

## 2014-08-25 NOTE — Consult Note (Signed)
PATIENT NAME:  Benjamin Freeman, Seyon F MR#:  [id number] DATE OF BIRTH:  1931-12-25  DATE OF CONSULTATION:  12/04/2013  REFERRING PHYSICIAN:  CONSULTING PHYSICIAN:  Laurier NancyShaukat A. Yailyn Strack, MD  INDICATION FOR CONSULTATION: Chest pain.   HISTORY OF PRESENT ILLNESS: This is a 81 year old African American male who is a very poor historian, presented to the Emergency Room with right-sided chest pain, radiation to his right jaw and arm, associated with some shortness of breath. Apparently he also has been not completing his dialysis; and chest x-ray was abnormal suggestive of possible pneumonia and been started on antibiotics. I was asked to evaluate the patient because of chest pain. Patient denies any chest pain right now.   PAST MEDICAL HISTORY: He has history of coronary artery disease, [place] and was told that he had 30% to 40% blockage. He presented to our office last year and on 10/14/2012, he had a CT of coronaries which showed calcium score of 62, and mild left circumflex LAD and RCA disease. There was no obstructive disease. He at that time was having atypical chest pain, just like now. He also has a history of hypertension, history of hyperlipidemia, diabetes type 2; also had ischemic stroke in 1984; he had a TEE done on 03/25/2011, which showed ascending aorta 4.5 cm mildly dilated without dissection; other past medical history is history of hemorrhoidectomy.   SOCIAL HISTORY: Denies EtOH abuse, no smoking.   ALLERGIES: None.   FAMILY HISTORY: Unremarkable.   PHYSICAL EXAMINATION: GENERAL: He is alert and oriented, but a bit confused.  VITAL SIGNS: Stable and the pulse is 82, it is normal sinus rhythm.  HEENT: Revealed no JVD. LUNGS: Clear, no rhonchi.  HEART: Regular rate, rhythm, normal S1, S2, no audible murmur.  ABDOMEN: Soft, nontender, positive bowel sounds.  EXTREMITIES: No pedal edema.  NEUROLOGIC: He is alert and oriented, but a bit confused about his  whereabouts and his medications.   First set of cardiac enzymes are negative.   His EKG shows normal sinus rhythm, 82 beats per minute. No acute changes.   ASSESSMENT AND PLAN: Atypical chest pain. He was evaluated last year and had a calcium score of 62, and no significant coronary artery disease on CT of coronaries. He presents now with atypical chest pain right-sided with right jaw pain. First set of cardiac enzymes and EKG are unremarkable.   Advise cycling enzymes 3 times, if he rules out for MI, may consider doing outpatient stress test and an echocardiogram; although the patient is unreliable, we will have to look into that, whether he will show up because since last year, he has not shown up in the office. We will have to consider all of these parameters and maybe the family can bring him to the office and do these tests if MI is rule out.    ____________________________ Laurier NancyShaukat A. Gracelynn Bircher, MD sak:nt D: 12/04/2013 16:38:00 ET T: 12/04/2013 17:23:49 ET JOB#: [id number]  cc: Laurier NancyShaukat A. Hannelore Bova, MD, <Dictator> Laurier NancySHAUKAT A Yan Okray MD ELECTRONICALLY SIGNED 12/07/2013 12:03

## 2014-08-25 NOTE — Discharge Summary (Signed)
PATIENT NAME:  Benjamin Freeman, [name] MR#:  [id number] DATE OF BIRTH:  1931/08/19  DATE OF ADMISSION:  07/31/2013 DATE OF DISCHARGE:  08/02/2013  DISCHARGE DIAGNOSES: 1. Altered mental status most likely postictal confusion.  2. End-stage renal disease on hemodialysis.  3. Diabetes mellitus, type II  4. Previous episodes of encephalopathy.    DISCHARGE MEDICATIONS: Discharge medical reconciliation reviewed by me.   IMAGING: CT head negative for acute infarct or stroke.   HOSPITAL COURSE: Was admitted with altered mental status. He had a brief episode of hypoxia; however, admitting team attributed this to pneumonia, although his chest x-ray was clear with normal white cell count. Discontinue antibiotics without any change in his respiratory status or his temperature or white cell count. The patient underwent hemodialysis yesterday without any complications. The patient was skilled to home with home health and was discharged in satisfactory condition.   DISCHARGE INSTRUCTIONS:  DIET: Renal diet, ADA diet.   ACTIVITY: As tolerated.   Follow-up Dr. Ellsworth Lennoxejan-Sie in 1 to 2 weeks.   DISCHARGE PROCESS TIME SPENT: 35 minutes.   ____________________________ Silas FloodSheikh A. Ellsworth Lennoxejan-Sie, MD sat:sg D: 08/02/2013 12:44:03 ET T: 08/02/2013 12:59:24 ET JOB#: [id number]  cc: Sheikh A. Ellsworth Lennoxejan-Sie, MD, <Dictator> Charlesetta GaribaldiSHEIKH A TEJAN-SIE MD ELECTRONICALLY SIGNED 08/08/2013 13:29

## 2014-08-25 NOTE — H&P (Signed)
PATIENT NAME:  Benjamin Freeman, [name] MR#:  [id number] DATE OF BIRTH:  [birth year]  DATE OF ADMISSION:  12/04/2013  PRIMARY CARE PHYSICIAN: Dr. Ellsworth Lennox.  REQUESTING PHYSICIAN: Dr. Clair Gulling.   CHIEF COMPLAINT: Chest pain.   HISTORY OF PRESENT ILLNESS: The patient is a 81 year old male with a known history of hypertension, diabetes, end-stage renal disease on hemodialysis Monday, Wednesday, Fridays being admitted for unstable angina. The patient has been having on and off sweats the last 2 nights associated with some low-grade fever and intermittent chest pain for about the same duration. He was scheduled for dialysis today, but his chest pain continued to get worse and he was also feeling a lot more sweaty. The chest pain was more substernal and was radiating to both sides of arm and jaw and family decided to bring him to the Emergency Department. While in the ED, he was found to have possible early pneumonitis based on chest x-ray and some of his symptoms including low-grade fever and sweats. He was given aspirin and Lasix and is being admitted for further evaluation and management as he has also missed a dialysis.   PAST MEDICAL HISTORY:  1. Dementia. 2. Diabetes.  3. End-stage renal disease on hemodialysis Monday, Wednesday, Friday.  4. Hypertension. 5. COPD.  6. Seizure disorder.   SOCIAL HISTORY: Remote tobacco abuse. No alcohol or drug use.   FAMILY HISTORY: Positive for hypertension.  ALLERGIES: No known drug allergies.   REVIEW OF SYSTEMS: Unable to obtain secondary to patient's underlying dementia.   MEDICATIONS AT HOME:  1. Amlodipine 10 mg p.o. daily.  2. Cardura XL 8 mg p.o. daily.  3. Crestor 20 mg p.o. at bedtime.  4. Dexilant 60 mg p.o. daily.  5. Vit D 50,000 international units once a week.  6. Fish oil 1000 mg p.o. b.i.d.  7. Hydralazine 100 mg p.o. daily. 8. Keppra 750 mg p.o. daily.  9. Insulin Lantus 12 units subcutaneous at bedtime.  10. Metoprolol 50 mg p.o.  b.i.d.  11. Plavix 75 mg p.o. daily.  12. Sertraline 50 mg p.o. daily.  13. Vitamin D3 at 1000 international units once daily.  14. Xanax 0.25 mg p.o. 3 times a day as needed.   PHYSICAL EXAMINATION:  VITAL SIGNS: Temperature 98.7, heart rate 76 per minute, respirations 18 per minute, blood pressure 169/77 mmHg, saturating 97% on room air.  GENERAL: The patient is a 81 year old male lying in bed comfortably without any acute distress.  EYES: Pupils equal, round, reactive to light and accommodation. No scleral icterus. Extraocular muscles intact.  HEENT: Head atraumatic, normocephalic. Oropharynx and nasopharynx clear.  NECK: Supple. No jugular venous distention. No thyroid enlargement or tenderness.  LUNGS: Clear to auscultation bilaterally. No wheezing, rales, rhonchi, or crepitation.  CARDIOVASCULAR: S1, S2 normal. No murmurs, rubs, or gallops.  ABDOMEN: Soft, nontender, nondistended. Bowel sounds present. No organomegaly or mass.  EXTREMITIES: No pedal edema, cyanosis, clubbing.  NEUROLOGIC: Cranial nerves II through XII intact. Muscle strength 5/5 in extremities. Sensation intact. PSYCHIATRY: The patient's mood and affect seems blunted and flat. He is awake and oriented x 3. Insight and judgment seems poor.  SKIN: No obvious rash, lesion, or ulcer.  LABORATORY PANEL: Normal BMP except BUN of 73, creatinine 12.26, normal liver function tests. Normal first set of troponins. Normal CBC except hemoglobin of 10.4, hematocrit 32.0. Urinalysis is negative.   Chest x-ray in the ED showed mild CHF with interstitial edema versus mild pneumonitis.   EKG did not show any  acute ST-T changes.   IMPRESSION AND PLAN:  1. Unstable angina. We will do serial troponin, obtain cardiology consult, continue Plavix. Monitor him on telemetry.  2. Pneumonitis/early pneumonia. Will continue Levaquin at this time.  3. Diabetes. We will continue home medication.  4. End-stage renal disease on hemodialysis  Monday, Wednesday, Friday. Consult nephrology for dialysis need.   CODE STATUS: Full code.   TOTAL TIME TAKING CARE OF THIS PATIENT: 40 minutes.    ____________________________ Ellamae SiaVipul S. Sherryll BurgerShah, MD vss:lt D: 12/04/2013 16:24:26 ET T: 12/04/2013 18:01:17 ET JOB#: [id number]  cc: Sharnay Cashion S. Sherryll BurgerShah, MD, <Dictator> Sheikh A. Ellsworth Lennoxejan-Sie, MD Laurier NancyShaukat A. Khan, MD Ellamae SiaVIPUL S [facility] MD ELECTRONICALLY SIGNED 12/05/2013 17:37

## 2014-08-25 NOTE — Consult Note (Signed)
CHIEF COMPLAINT and HISTORY:  Subjective/Chief Complaint AV fistula evaluation   History of Present Illness 81 year old African American male who was schedule for fistulogram with Dr. Gilda CreaseSchnier today. He was found to have sats into 80's then dropped to 78%. Therefore, procedure was not done, and he was admitted for further evaluation and treatment. He does not provide reliable history as that he is confused right now. According to documentation dialysis yesterday went fine.   PAST MEDICAL/SURGICAL HISTORY:  Past Medical History:   hemodyalysis:    hyperlipids:    OBESITY:    Encephalopathy:    seizures:    Renal Insufficiency:    cardiac- pt cannot specify:    Hypertension:    Diabetes:    Fistula:    Cataract Extraction:   ALLERGIES:  Allergies:  No Known Allergies:   HOME MEDICATIONS:  Home Medications: Medication Instructions Status  Lantus Solostar Pen 100 units/mL subcutaneous solution 6 unit(s) subcutaneous once a day (at bedtime) Active  hydrALAZINE 100 mg oral tablet 1 tab(s) orally 3 times a day Active  Fish Oil 1000 mg oral capsule 1 cap(s) orally once a day Active  Rena-Vite Vitamin B Complex with C and Folic Acid oral tablet 1 tab(s) orally once a day Active  Crestor 10 mg oral tablet 1 tab(s) orally once a day (in the evening) Active  Keppra 500 mg oral tablet 1 tab(s) orally once a day (in the morning) Active  Keppra 250 mg oral tablet 1 tab(s) orally once a day after dialysis on Monday, Wednesday, and Friday, along with Keppra 500 mg Active  Zoloft 50 mg oral tablet 1 tab(s) orally once a day (in the morning) Active  Vitamin D3 1000 intl units oral capsule 1 cap(s) orally once a day Active  amLODIPine 10 mg oral tablet 1 tab(s) orally once a day Active  doxazosin 8 mg oral tablet 1 tab(s) orally once a day (in the morning) with meal Active  Drisdol 50,000 intl units oral capsule 1 cap(s) orally once a week Active  metoprolol 25 mg oral tablet 1 tab(s)  orally 2 times a day Active  Combivent CFC free 100 mcg-20 mcg/inh inhalation aerosol 1 puff(s) inhaled 4 times a day Active   Family and Social History:  Family History Unable to obtain   Social History negative ETOH, negative Illicit drugs   Review of Systems:  ROS Pt not able to provide ROS  Confused, dementia   Physical Exam:  GEN no acute distress   NECK supple   RESP normal resp effort  coarse breath sounds   CARD regular rate  No LE edema   VASCULAR ACCESS AV fistula present  Good bruit  Good thrill   ABD denies tenderness  normal BS   LYMPH negative neck   EXTR negative cyanosis/clubbing   SKIN normal to palpation   PSYCH alert, dementia, confused   LABS:  Laboratory Results: Routine Hem:    27-Jan-15 10:54, CBC Profile  WBC (CBC) 7.0  RBC (CBC) 3.26  Hemoglobin (CBC) 9.4  Hematocrit (CBC) 27.9  Platelet Count (CBC) 240  MCV 86  MCH 28.9  MCHC 33.8  RDW 14.5  Neutrophil % 56.1  Lymphocyte % 26.1  Monocyte % 15.5  Eosinophil % 1.3  Basophil % 1.0  Neutrophil # 3.9  Lymphocyte # 1.8  Monocyte # 1.1  Eosinophil # 0.1  Basophil # 0.1  Result(s) reported on 30 May 2013 at 11:12AM.   ASSESSMENT AND PLAN:  Plan Acute bronchitis  with acute respiratory failure in a patient with questionable chronic obstructive pulmonary disease. Started on DuoNebs scheduled along with intravenous steroids and Levaquin.   End-stage renal disease, on hemodialysis with a left upper extremity fistula. Used yesterday at dialysis without issues. Fistula is fine to use. If patient is still in hospital by Friday, may be able to do fistulogram then, otherwise we can schedule as outpatient.   Electronic Signatures: Governor Specking (PA-C)  (Signed 27-Jan-15 17:54)  Authored: Chief Complaint and History, PAST MEDICAL/SURGICAL HISTORY, ALLERGIES, HOME MEDICATIONS, Family and Social History, Review of Systems, Physical Exam, LABS, Assessment and Plan   Last Updated:  27-Jan-15 17:54 by Governor Specking (PA-C)

## 2014-08-25 NOTE — Discharge Summary (Signed)
PATIENT NAME:  Benjamin Freeman, [name] MR#:  [id number] DATE OF BIRTH:  25-Feb-1932  DATE OF ADMISSION:  12/04/2013 DATE OF DISCHARGE:  12/07/2013  ADMITTING PHYSICIAN:  Patricia PesaVipul S Shah, MD    DISCHARGE PHYSICIAN:  Charlesetta GaribaldiSheikh A Tejan-Sie, MD   ADMITTING DIAGNOSES:  Unstable angina, pneumonitis or early pneumonia, end-stage renal disease and diabetes.   DISCHARGE DIAGNOSES:  Pneumonia, chest pain, end-stage renal disease.   PROCEDURES:  Hemodialysis.   CONSULTATIONS: Nephrology, Dr. Cherylann RatelLateef; cardiology, Dr. Adrian BlackwaterShaukat Khan   IMAGING:  Chest x-ray on 12/04/2013, with suggestion of interstitial edema versus mild pneumonitis.   HISTORY OF PRESENT ILLNESS: This gentleman was admitted through the Emergency Room presenting with chest pain, suggesting unstable angina, and radiological suggestive of pneumonitis, please refer to the history and physical for details.   HOSPITAL COURSE: The patient was admitted to a monitored bed. His cardiac enzymes were negative x 3 at 0.02. As far as EKG, it did not show any acute ST or T wave abnormalities. The patient was treated with antibiotics for community-acquired pneumonia without any complications. He underwent hemodialysis without complications also, and was discharge to home on a course of oral antibiotics with instructions to followup with cardiology as an outpatient. Cardiology did not believe the patient required inpatient stress testing, or luminal investigation. The patient was discharged to home in satisfactory condition.   INSTRUCTIONS:  DIET: Renal diet, cardiac diet.   ACTIVITY: As tolerated.   FOLLOWUP: With cardiology as scheduled, with nephrology as scheduled, and to resume hemodialysis.   DISCHARGE MEDICATIONS: Please refer to medical reconciliation that was completed, and reviewed by me.   DISCHARGE PROCESS TIME SPENT: 35 minutes.    ____________________________ Silas FloodSheikh A. Ellsworth Lennoxejan-Sie, MD sat:nt D: 12/26/2013 14:06:00 ET T: 12/26/2013 21:46:14  ET JOB#: [id number]  cc: Marland McalpineSheikh A. Ellsworth Lennoxejan-Sie, MD, <Dictator> Charlesetta GaribaldiSHEIKH A TEJAN-SIE MD ELECTRONICALLY SIGNED 01/01/2014 14:11

## 2014-08-25 NOTE — H&P (Signed)
PATIENT NAME:  Benjamin Freeman, Benjamin Freeman MR#:  [id number] DATE OF BIRTH:  12-Jan-1932  DATE OF ADMISSION:  05/30/2013  PRIMARY CARE PHYSICIAN: Bluford MainSheikh Tejan-Sie, MD  CHIEF COMPLAINT: Shortness of breath and hypoxia with sats at 78% on room air.   HISTORY OF PRESENTING ILLNESS: A 81 year old male patient with history of COPD, end-stage renal disease, hypertension, and diabetes who is here in the hospital in special procedures area for a fistulogram with Dr. Gilda CreaseSchnier. He was noticed to have saturations at 87% initially on room air which later decreased to 78%. His procedure was canceled. The patient was noticed to have some wheezing and the hospitalist team has been consulted.   The patient does not contribute to the history. Wife mentions that secondary to his dementia the patient has not been very verbal for the past year. He does ambulate on his own per his wife. The patient presently seems in respiratory distress using accessory muscles. Most of the history was obtained from the wife.   The patient has been feeling weak over the past week. He has had decreased appetite, has been missing his medications. The wife mentions that he does not have COPD, is not on inhalers, but looking at old history the patient seems to have COPD. He did smoke in the past, but quit about 20 years back.   PAST MEDICAL HISTORY: 1.  Endstage renal disease on hemodialysis.  2.  Diabetes mellitus.  3.  Hypertension.  4.  CVA. 5.  Seizures, on Keppra.  6.  COPD, questionable.   ALLERGIES: No known drug allergies.   SOCIAL HISTORY: The patient quit smoking 2 decades back. No alcohol. No illicit drugs. Lives at home with his wife.   CODE STATUS: FULL code.  FAMILY HISTORY: Hypertension.   HOME MEDICATIONS:  1.  Doxazosin 8 mg daily.  2.  Keppra 500 mg oral daily and 250 mg on dialysis days.  3.  Zoloft 50 mg daily.  4.  Lantus 6 units subcutaneous at bedtime.  5.  Crestor 10 mg daily.  6.  Metoprolol tartrate 50 mg 2  times a day. 7.  Amlodipine 10 mg daily. 8.  Fish oil 1000 mg daily.  9.  Hydralazine 100 mg 3 times a day.  10.  [place] multivitamin daily.  11.  Vitamin D3 1000 international units daily.  12.  Vitamin D2 50,000 international units oral weekly.   REVIEW OF SYSTEMS: Unobtainable secondary to the patient's severe dementia.   PHYSICAL EXAMINATION:  VITAL SIGNS: Temperature 99.2, pulse 61, respirations 24, blood pressure 114/55, and saturating 78% on room air and 92% on 4 liters oxygen.  GENERAL: Moderately built, elderly African American male patient lying in bed in mild respiratory distress. PSYCHIATRIC: Drowsy. Cannot assess his orientation. Flat affect.  HEENT: Atraumatic, normocephalic. Oral mucosa moist and pink. External ears and nose normal. Pallor positive. No icterus. Pupils bilaterally equal and reactive to light.  NECK: Supple. No thyromegaly. No palpable lymph nodes. Trachea midline. No carotid bruit or JVD.  CARDIOVASCULAR: S1 and S2 without any murmurs. Peripheral pulses 2+. No edema.  RESPIRATORY: Has bilateral wheezing, decreased air entry. No crackles.  GASTROINTESTINAL: Soft abdomen, nontender. Bowel sounds present. No organomegaly palpable.  SKIN: Warm and dry. No petechiae, rash, or ulcers.  MUSCULOSKELETAL: No joint swelling, redness, or effusion of the large joints. Normal muscle tone.  NEUROLOGIC: Seems to move all 4 extremities, but does not follow commands, limiting assessment. Reflexes 2+.  LYMPHATIC: No cervical lymphadenopathy.  EXTREMITIES: Has left  upper extremity fistula.   LABORATORY STUDIES: Show glucose of 106, BUN 37, creatinine 7.49, sodium 134, potassium 3.6, AST, ALT, alkaline phosphatase, and bilirubin normal. WBC 7, hemoglobin 9.4, and platelets 240.   ASSESSMENT AND PLAN: 1.  Acute bronchitis with acute respiratory failure in a patient with questionable chronic obstructive pulmonary disease. We will start the patient on DuoNebs scheduled along  with intravenous steroids and Levaquin. We will get a stat chest x-ray. The patient did have dialysis yesterday. We will have to watch out for any pulmonary edema. He has temperature of 99.2. White count is normal. We will also check for any pneumonia. Depending on how his sats are doing, the patient may need oxygen while being discharged home. 2.  End-stage renal disease, on hemodialysis with a left upper extremity fistula. The patient is supposed to get a fistulogram. As per wife, his dialysis yesterday was uneventful. We will consult vascular surgery to be on board for a fistulogram and consult nephrology for his dialysis needs.  3.  Hypertension. Continue home medications.  4.  Worsening dementia. The patient does seem to have worsening dementia, is close to being nonverbal, although ambulates on his own. Wife is requesting that the patient be discharged to either rehab or assisted living facility. Her main concern is that she will have knee surgery soon and does not want to leave her husband alone at home. We will consult social worker for help.  5.  Anemia of chronic disease, stable.  6.  Insulin-dependent diabetes mellitus. Continue his Lantus and sliding scale insulin. 7.  Deep vein thrombosis prophylaxis with heparin.   CODE STATUS: FULL code.   TIME SPENT: Today on this case was 45 minutes.  ____________________________ Molinda Bailiff Sirius Woodford, MD srs:sb D: 05/30/2013 12:02:19 ET T: 05/30/2013 12:20:25 ET JOB#: [id number]  cc: Wardell Heath R. Iven Earnhart, MD, <Dictator> Sheikh A. Ellsworth Lennox, MD Renford Dills, MD Munsoor Lizabeth Leyden, MD Orie Fisherman MD ELECTRONICALLY SIGNED 05/30/2013 15:15

## 2014-08-26 NOTE — Discharge Summary (Signed)
PATIENT NAME:  Benjamin Freeman, [name] MR#:  [id number] DATE OF BIRTH:  [birth year]  DATE OF ADMISSION:  04/08/2011 DATE OF DISCHARGE:  04/10/2011  DISCHARGE DIAGNOSES: Pneumonia.  DISCHARGE MEDICATIONS:  Patient is to resume all home medications. In addition, patient is to begin taking the following:  1. Hydralazine, 25 mg, one by mouth 3 times a day taken in addition to hydralazine 50 mg 3 times a day for a total daily dose of 75 mg 3 times a day.  2. Azithromycin 250 mg one by mouth once daily for two more days.  3. Augmentin 875/125 one by mouth twice a day for seven more days.   HOSPITAL COURSE: This patient is a 81 year old African American male who presented with a chief complaint of low-grade fever, cough productive of clear phlegm, and confusion. His symptoms began the day prior to admission. Please see History and Physical from date of admission for full details in this regard. The patient was admitted to the hospital for a pneumonia and was started on IV antibiotics and other standard appropriate therapies for pneumonia. The patient rapidly improved during his hospitalization and was ready for discharge on 04/10/2011. His blood pressure did run a little high during his hospitalization, so additional hydralazine was given, and his blood pressure responded well to this. He will be discharged on additional hydralazine.   His other various medical comorbidities remained stable throughout his hospitalization.  On day of discharge, patient said he felt completely better and was eager to return home. The patient was deemed ready for discharge by Internal Medicine and was discharged home in a satisfactory condition. He will follow up with [facility] of [facility] next week.  DIET:  Low-salt, ADA, renal diet.   ACTIVITY:  Activity as tolerated.   FOLLOWUP VISIT: On 04/15/2011 with Mariane MastersMartin Nelma Phagan, PA-C, MSPAS.        TIME SPENT:  Discharge time spent including coordination  of care, patient education and the like was 30 minutes.     ____________________________ Mariane MastersMartin Vern Prestia, PA-C, MSPAS mgm:vtd D: 04/10/2011 15:56:09 ET T: 04/11/2011 12:29:20 ET JOB#: [id number]  cc: Burnett HarryMartin G. Shaune SpittleMayer, PA-C, MSPAS, <Dictator> Burnett HarryMARTIN G Birgitta Uhlir PA ELECTRONICALLY SIGNED 04/13/2011 20:03 Charlesetta GaribaldiSHEIKH A TEJAN-SIE MD ELECTRONICALLY SIGNED 06/05/2011 12:14

## 2014-08-27 LAB — RENAL FUNCTION PANEL
ANION GAP: 16 (ref 7–16)
Albumin: 2.8 g/dL — ABNORMAL LOW
BUN: 74 mg/dL — ABNORMAL HIGH
CO2: 28 mmol/L
CREATININE: 10.57 mg/dL — AB
Calcium, Total: 7.6 mg/dL — ABNORMAL LOW
Chloride: 91 mmol/L — ABNORMAL LOW
EGFR (African American): 5 — ABNORMAL LOW
EGFR (Non-African Amer.): 4 — ABNORMAL LOW
GLUCOSE: 152 mg/dL — AB
POTASSIUM: 3.2 mmol/L — AB
Phosphorus: 3.5 mg/dL
Sodium: 135 mmol/L

## 2014-08-27 LAB — CBC WITH DIFFERENTIAL/PLATELET
BASOS ABS: 0 10*3/uL (ref 0.0–0.1)
BASOS PCT: 0.3 %
EOS ABS: 0 10*3/uL (ref 0.0–0.7)
Eosinophil %: 0.2 %
HCT: 32.4 % — AB (ref 40.0–52.0)
HGB: 10.9 g/dL — AB (ref 13.0–18.0)
LYMPHS PCT: 20.4 %
Lymphocyte #: 1.6 10*3/uL (ref 1.0–3.6)
MCH: 27.2 pg (ref 26.0–34.0)
MCHC: 33.6 g/dL (ref 32.0–36.0)
MCV: 81 fL (ref 80–100)
MONOS PCT: 15.4 %
Monocyte #: 1.2 x10 3/mm — ABNORMAL HIGH (ref 0.2–1.0)
Neutrophil #: 5.1 10*3/uL (ref 1.4–6.5)
Neutrophil %: 63.7 %
Platelet: 306 10*3/uL (ref 150–440)
RBC: 3.99 10*6/uL — ABNORMAL LOW (ref 4.40–5.90)
RDW: 16.4 % — ABNORMAL HIGH (ref 11.5–14.5)
WBC: 8.1 10*3/uL (ref 3.8–10.6)

## 2014-09-02 NOTE — Discharge Summary (Signed)
PATIENT NAME:  Benjamin Freeman, Benjamin Freeman MR#:  [id number] DATE OF BIRTH:  May 06, 1931  DATE OF ADMISSION:  08/24/2014 DATE OF DISCHARGE:  08/27/2014  ADMITTING PHYSICIAN: Enid Baas, M.D.   DISCHARGING PHYSICIAN: Enid Baas, M.D.   PRIMARY CARE PHYSICIAN: Dr. Ellsworth Lennox.  CONSULTATIONS IN THE HOSPITAL: Nephrology consultation with Dr. Mosetta Pigeon.   PRIMARY NEPHROLOGIST:  Molli Barrows, M.D.   DISCHARGE DIAGNOSES: 1. Metabolic encephalopathy.  2. Metabolic alkalosis.  3. Severe dementia.  4. End-stage renal disease on Monday, Wednesday, Friday hemodialysis.  5. Hypertension.  6. Diabetes mellitus.  7. Seizure disorder.   DISCHARGE MEDICATIONS:  1. Vitamin D3 1000 international units p.o. daily.  2. Amlodipine 10 mg p.o. daily.  3. Dexilant 60 mg p.o. daily.  4. Xanax 0.25 mg p.o. 3 times a day p.r.n. for anxiety.  5. Imdur 30 mg p.o. daily.  6. Cardura 8 mg p.o. daily.  7. Combivent Respimat 1 to 4 times a day.  8. Crestor 10 mg p.o. daily.  9. Vitamin D 250,000 international units once a week.  10. Fish oil 1 gram capsule b.i.d.  11. Hydralazine 100 mg p.o. b.i.d.  12. Keppra 500 mg p.o. daily.  13. Lantus 6 units subcutaneously at bedtime. 14. Multivitamin 1 tablet p.o. daily.  15. Metoprolol 50 mg p.o. b.i.d.  16. Plavix 75 mg p.o. daily.  17. Sensipar 60 mg p.o. daily.  18. Sertraline 50 mg p.o. daily.  19. Ventolin inhaler 2 puffs q.6 h. p.r.n. for shortness of breath.  20. Renvela , 2 tablets 3 times a day.  21. Levaquin 250 mg every 48 hours for 4 days.   DISCHARGE DIET: Renal diet.   DISCHARGE ACTIVITY: As tolerated.    FOLLOWUP INSTRUCTIONS:  1. PCP follow-up in 1 to 2 weeks.  2. Nephrology follow-up for dialysis on Wednesday, 08/29/2014, as per schedule.  LABORATORIES AND IMAGING STUDIES PRIOR TO DISCHARGE: Sodium 137, potassium 3.2, chloride 91, bicarbonate 28, BUN 74, creatinine 10.4, glucose 152 and calcium of 7.6.   WBC 8.1, hemoglobin  10.9, hematocrit 32.4, platelet count 306,000.   ABG showing pH of 7.56, pCO2 of 39, pO2 of 78, bicarbonate of 35%, saturations of 96% on room air. serum ammonia within normal limits.   CT of the head showing atrophy and chronic ischemia. No acute intracranial abnormalities.   Chest x-ray on admission showing no acute disease, stable chronic reticular mild interstitial prominence noted.   BRIEF HOSPITAL COURSE: Mr. Avans is an 81 year old African American male with past medical history significant for hemodialysis, dementia, hypertension, who is living at home with his wife with unsteady gait, using a walker at baseline, presents to the Emergency Room after dialysis secondary to altered mental status.   1. Metabolic encephalopathy brought in from dialysis with altered mental status. Bicarbonate was noted to be greater than 40 with pH of greater than 7.6. Has metabolic alkalosis due to the bicarbonate bath during his dialysis. No source of infection identified. He started clearing up. His mental status was back to normal within less than 24 hours. His bicarbonate on metabolic panel is normal now. At baseline, he is alert, but not oriented, able to follow simple commands at times, but on admission he was completely lethargic. He is back to his baseline at this time.  2. End-stage renal disease on hemodialysis Monday, Wednesday, Friday hemodialysis. He is being dialyzed per schedule prior to discharge.  3. Severe dementia, again at baseline.  4. Seizure disorder on Keppra.  5. Hypertension. Continue home  medications.   His course has been otherwise uneventful in the hospital.   DISCHARGE CONDITION: Stable.   DISCHARGE DISPOSITION: To rehabilitation.   CODE STATUS: FULL CODE.   TIME SPENT ON DISCHARGE: 45 minutes.    ____________________________ Noely Kuhnle, MD rk:JT D: 04/Enid Baas25/2016 12:37:44 ET T: 08/27/2014 13:33:26 ET JOB#: [id number]  cc: Enid Baasadhika Sanoe Hazan, MD, <Dictator> Molli Barrowsaven  Voora, MD Unknown cc  Enid BaasADHIKA Rasha Ibe MD ELECTRONICALLY SIGNED 08/27/2014 15:32

## 2014-09-02 NOTE — H&P (Signed)
PATIENT NAME:  Benjamin Freeman, SOLTIS MR#:  [id number] DATE OF BIRTH:  [birth year]  DATE OF ADMISSION:  08/24/2014  ADMITTING PHYSICIAN: Enid Baas, M.D.   PRIMARY CARE PHYSICIAN:  Gillermo Murdoch, M.D.    PRIMARY CARDIOLOGIST:  Adrian Blackwater, M.D.   PRIMARY NEPHROLOGIST:   Molli Barrows, M.D., [facility] nephrology.   CHIEF COMPLAINT:  Altered mental status.   HISTORY OF PRESENT ILLNESS:  Benjamin Freeman is an 81 year old African-American male with past medical history significant for hypertension, diabetes, end-stage renal disease on Monday, Wednesday, Friday hemodialysis, history of COPD, seizure disorder, and dementia, who presents from the dialysis center secondary to worsening confusion.  The patient has known dementia at baseline, but is able to answer simple questions, however now appears more confused and not following commands consistency.   His wife is at bedside and she tells most of the history.  According to her, the patient uses a walker at baseline, but sometimes he gets confused and does not use anything to walk and he had a fall at the start of the week about 3 to 4 days ago.  He almost hit his head; he came to the Emergency Room and had a CT of his head which did not show any acute changes and was discharged home.   She feels like since then, he has been complaining of some headache.  Today, she was called from the dialysis center stating that he was more confused not following commands over there and they were sending him to the Emergency Room.  The patient is not febrile.  Laboratory studies are stable here other than metabolic alkalosis.     PAST MEDICAL HISTORY:  1.  Dementia.  2.  Diabetes mellitus.  3.  CAD without any intervention.  4.  End-stage renal disease on Monday, Wednesday, Friday hemodialysis.  5.  Hypertension.  6.  COPD not on home oxygen.  7.  Seizure disorder.   PAST SURGICAL HISTORY:  Left arm AV fistula placement.   ALLERGIES TO MEDICATIONS: No known drug  allergies.  HOME MEDICATIONS:   1.   Amlodipine 10 mg p.o. daily.  2.  Cardura 8 mg p.o. daily.  3.  Combivent Respimat 4 times a day.  4.  Crestor 10 mg every evening.  5.  Dexilant 60 mg p.o. daily.  4.  Drisdol 50,000 international units once a week orally.    5.  Fish oil 1 gram capsule b.i.d.  6.  Hydralazine 100 mg p.o. b.i.d.  7.  Imdur 30 mg p.o. daily.  8.  Keppra 500 mg daily and extra dose of 2 mg on dialysis days.  9.  Lantus 60 units at bedtime.  10. Levaquin 500 mg every other day.  11. Multivitamin 1 tablet p.o. daily.  12. Metoprolol 50 mg p.o. b.i.d.  13. Plavix 75 mg p.o. daily.  14. Prednisone 20 mg p.o. daily.  15. Renvela 100 mg 3 tablets 3 times a day with meals.  16. Sensipar 60 mg p.o. daily.  17. Ceftaroline 50 mg p.o. daily.  18. Ventolin inhaler q. 6 hours p.r.n. 2 puffs.  19. Vitamin D3 1000 international units daily.  20. Xanax 0.25 mg 3 times a day for anxiety.    SOCIAL HISTORY:  Lives at home with wife.  No smoking or alcohol use.  Currently he uses a walker for ambulation but very unsteady on feet.   FAMILY HISTORY:  History of hypertension and CAD.    REVIEW OF SYSTEMS: Difficult to be obtained  secondary to the patient's confusion.   PHYSICAL EXAMINATION:   VITAL SIGNS:  Temperature 98.1, pulse 90, respirations 18, blood pressure 134/58, pulse oximetry is 89% on room air.   GENERAL:  Well-built, moderately nourished male lying in bed not in any acute distress.  HEENT:  Normocephalic, atraumatic.  Pupils postsurgical equal, round, reacting to light. Anicteric sclerae.  Extraocular movements intact.  OROPHARYNX:  Clear without erythema, mass, or exudates.  NECK:  Supple.  No thyromegaly, JVD or carotid bruits.  No lymphadenopathy.  LUNGS:  Moving air bilaterally.  Decreased bibasilar breath sounds.  No use of accessory muscles for breathing.  No wheezing heard.  CARDIOVASCULAR:  S1, S2, regular rate and rhythm, 3/6 systolic murmur present.  No  rubs or gallops.  ABDOMEN:  Soft, nontender, nondistended.  No hepatosplenomegaly.  Normal bowel sounds.  EXTREMITIES:  1+ edema.  No clubbing or cyanosis.  2+ dorsalis pedis pulses palpable bilaterally.  SKIN:  No acne, rash or lesions.  LYMPHATICS:  No cervical lymphadenopathy.  NEUROLOGIC:  Not following commands.  Able to move all four extremities in bed.  No extracranial deficits noted.  PSYCHOLOGICAL:  The patient is awake, alert, again appears confused, not oriented at all.   LABORATORY DATA:   WBC 8.7, hemoglobin 11.3, hematocrit 34.6, platelet count 167,000.  Sodium 140, potassium 3.1, chloride 94, bicarbonate 36, BUN 16, creatinine 4.3, glucose 159, and calcium of 8.3.  ALT is 17, AST 40. alkaline phosphatase 60, total bilirubin 0.7, and albumin of 3.2.  ABG showing pH of 7.62, pO2 of 47, pCO2 40, bicarbonate 41, and saturations of 87% on room air.  Plasma ammonia is only 13.  A CT of the head is pending at this time.  Chest x-ray showing COPD changes, scarring at the lung bases.  No superimposed infiltrate.  BNP is within normal limits at 92.     ASSESSMENT AND PLAN:  This is an 81 year old male with a history of dementia, diabetes, hypertension, end-stage renal disease on hemodialysis, seizure disorder, who presents secondary to worsening confusion.  1.  Altered mental status.  No source of infection identified.  Ammonia is normal, not sure what the reason is.  The only abnormality on the laboratory studies is his metabolic alkalosis.  Maybe after his dialysis today, will have nephrology consult and see if they need to dialyze him or adjust fluids and electrolytes again tomorrow.  Continue to monitor, neuro-checks at this time and monitor with fall precautions.  CT of the head ordered, especially with the recent fall even though the old CT is negative.  Will repeat to make sure no acute changes at this time. 2.  End-stage renal disease on hemodialysis Monday, Wednesday, Friday.  Nephrology  consulted.  Continue his medications.  3.  Hypertension.  Home medications will be continued.  4.  Seizure disorder on Keppra.   5.  Diabetes mellitus on Lantus.  6.  Patient on prednisone  and Levaquin.  I am not sure if he was recently taking antibiotics.  For  now will continue until further verification.   TOTAL TIME SPENT ON ADMISSION:  50 minutes.      ____________________________ Enid Baasadhika Gabryel Files, MD [phone] D: 08/24/2014 18:35:17 ET T: 08/24/2014 19:43:48 ET JOB#: [id number]  cc: Enid Baasadhika Kelsha Older, MD, <Dictator> Molli Barrowsaven Voora, MD Silas FloodSheikh A. Ellsworth Lennoxejan-Sie, MD Enid BaasADHIKA Harjas Biggins MD ELECTRONICALLY SIGNED 08/27/2014 15:17

## 2014-10-03 DEATH — deceased

## 2015-07-02 IMAGING — CT CT ANGIO CHEST
2 of 6 series · 19 of 46 positions shown · IV contrast (APPLIED)
Comparison: DG CHEST 1V PORT dated 07/31/2013

CLINICAL DATA: Shortness of breath, chest pain

EXAM:
CT ANGIOGRAPHY CHEST WITH CONTRAST
TECHNIQUE: Multidetector CT imaging of the chest was performed using the
standard protocol during bolus administration of intravenous
contrast. Multiplanar CT image reconstructions and MIPs were
obtained to evaluate the vascular anatomy.
CONTRAST:  75 mL Isovue 370

[Series 5: pe thins 1.5 · axial · 0.68mm/px · z∈[+387,+645]mm · 16 of 241 slices shown]
[im 13/241  lung]
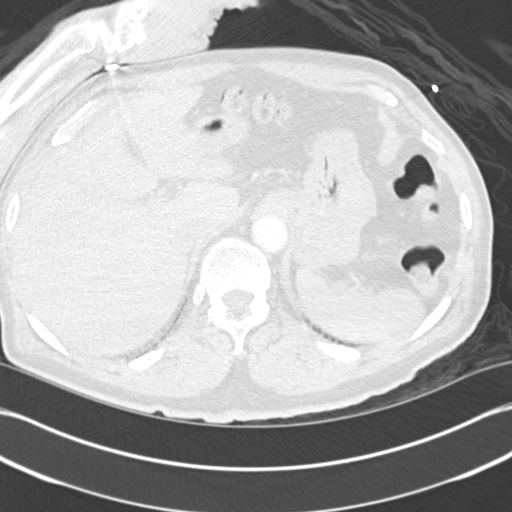
[im 26/241  soft-tissue]
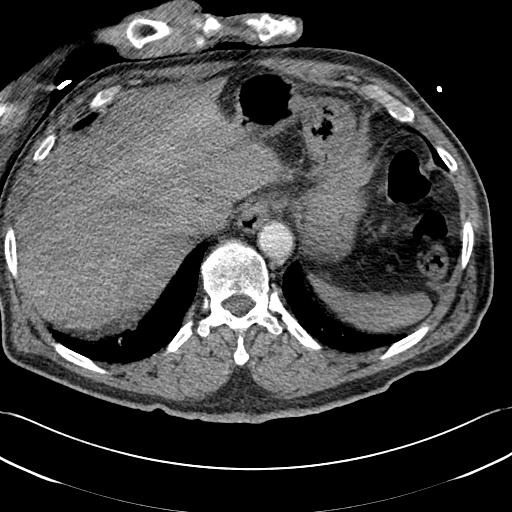
[im 38/241  lung]
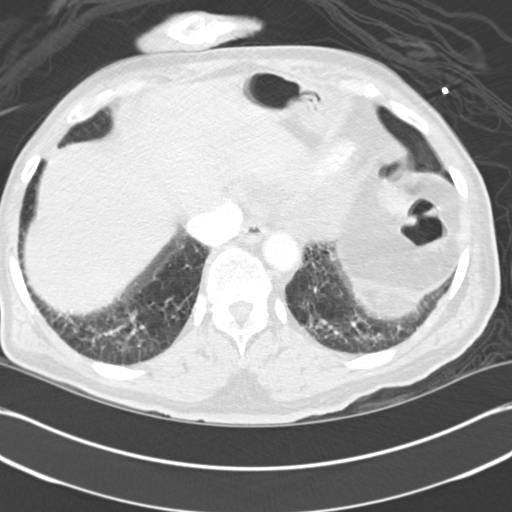
[im 51/241  soft-tissue]
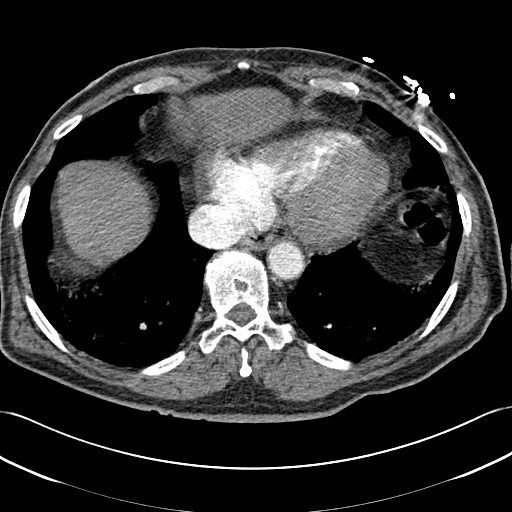
[im 76/241  lung]
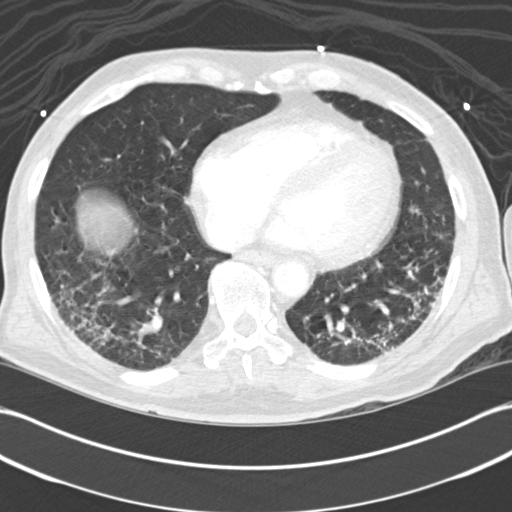
[im 89/241  soft-tissue]
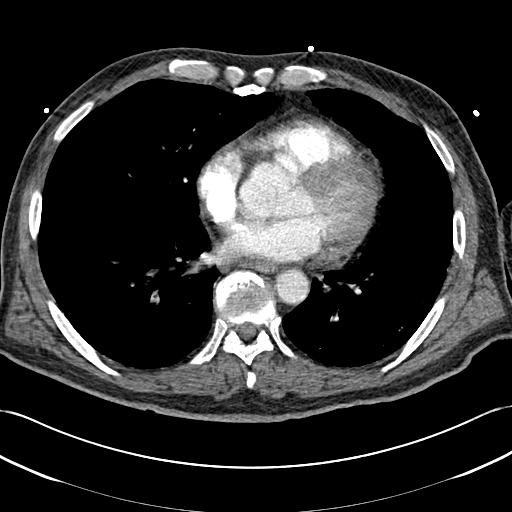
[im 102/241  lung]
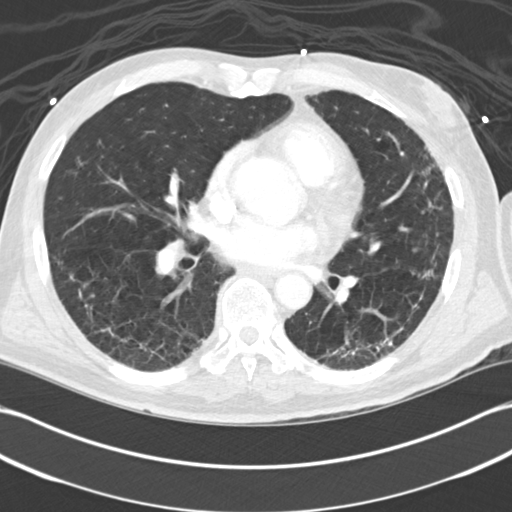
[im 114/241  soft-tissue]
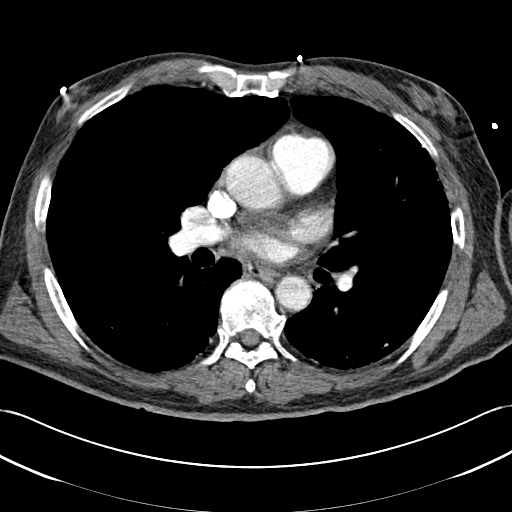
[im 127/241  lung]
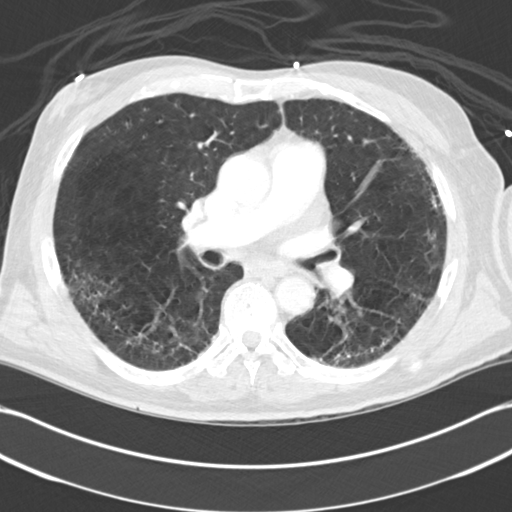
[im 139/241  soft-tissue]
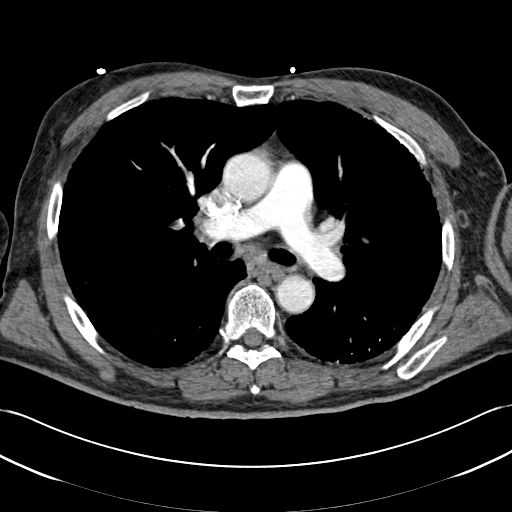
[im 152/241  lung]
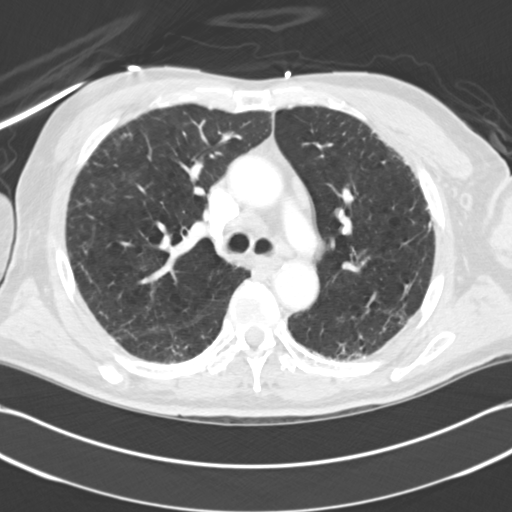
[im 165/241  soft-tissue]
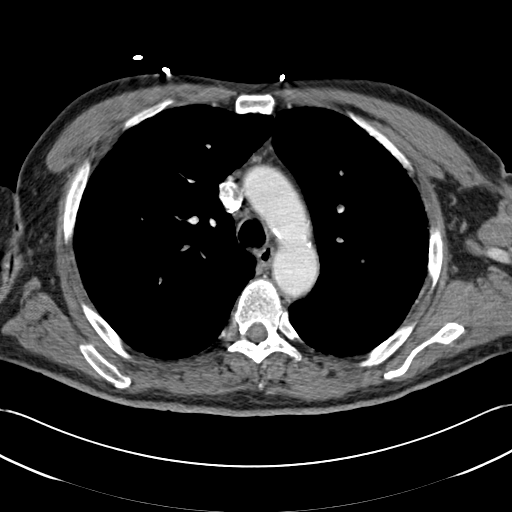
[im 190/241  lung]
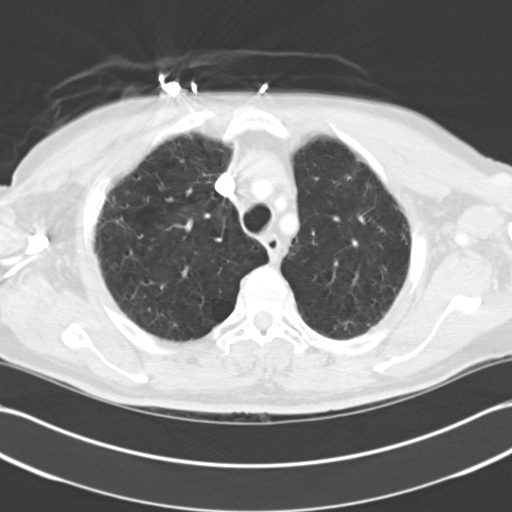
[im 203/241  soft-tissue]
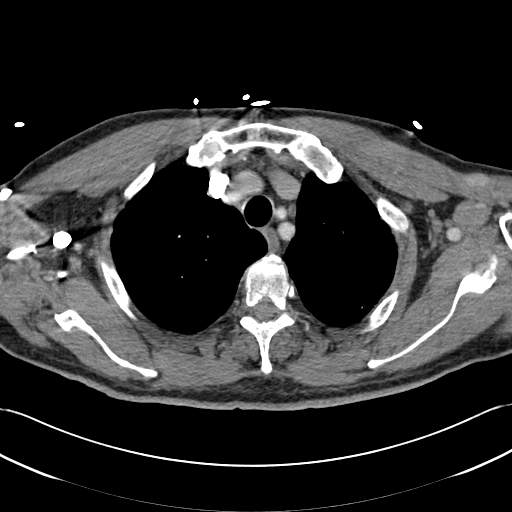
[im 215/241  lung]
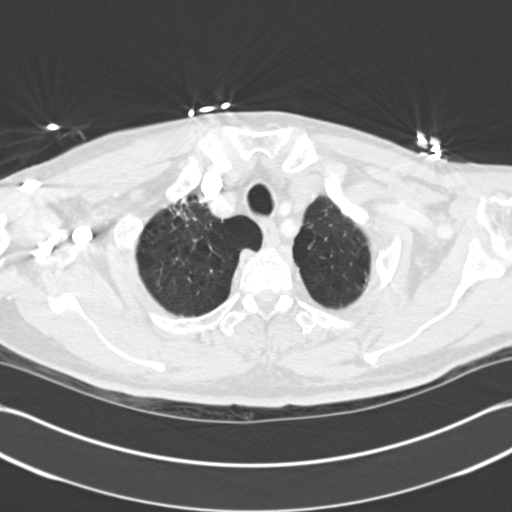
[im 228/241  soft-tissue]
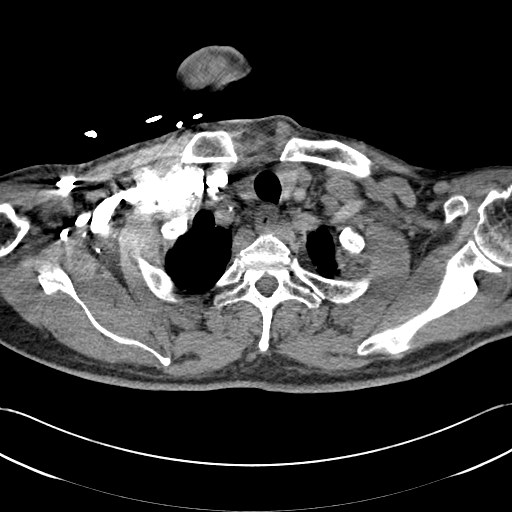

[Series 7: cor mpr 2.0 · coronal · 0.58mm/px · 3 of 124 slices shown]
[im 31/124  soft-tissue]
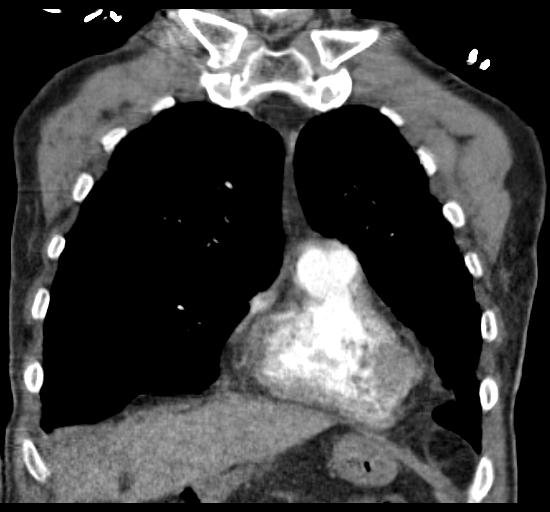
[im 62/124  soft-tissue]
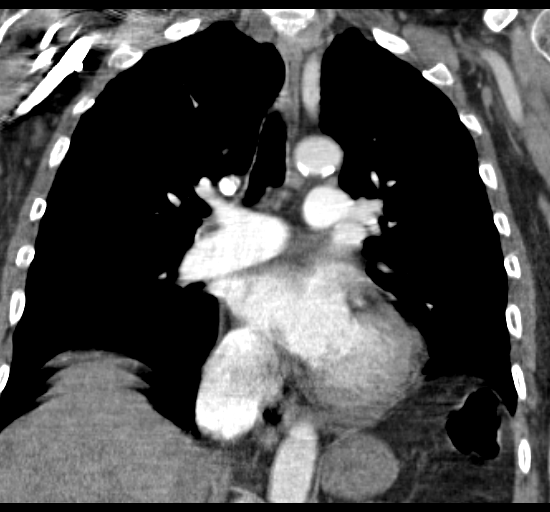
[im 93/124  soft-tissue]
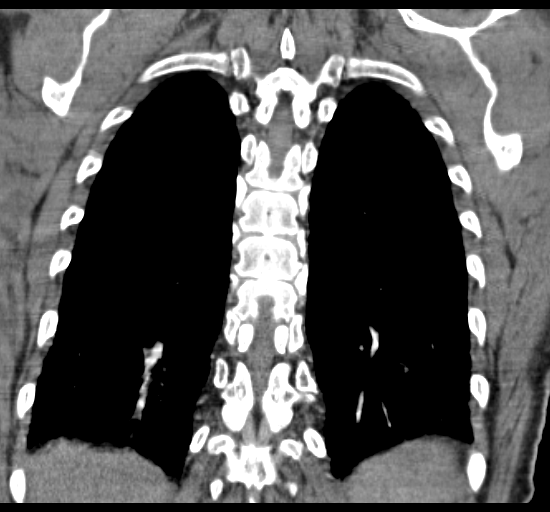

[19 of 46 positions shown; findings below may reference images not displayed]

FINDINGS: The thoracic inlet is unremarkable.

Prominent 1.2 cm and 1.1 cm precarinal and subcarinal lymph nodes
appreciated. No mediastinal masses.

There no filling defects within the main, lobar, or segmental
pulmonary arteries.

Diffuse emphysematous changes throughout both lungs with areas of
greatest intensity in the upper lobes. There is diffuse interlobular
and subpleural septal thickening with areas of greatest confluence
in the lung bases. No focal regions of consolidation are
appreciated. The central airways are patent.

The visualized upper abdominal viscera are unremarkable.

No aggressive appearing osseous lesions.

Review of the MIP images confirms the above findings.
IMPRESSION: No evidence of pulmonary arterial embolic disease.

Moderate to severe mixed emphysematous and interstitial fibrotic
changes throughout the right and left lungs.

Likely reactive mediastinal lymph nodes.
